# Patient Record
Sex: Female | Born: 1955 | Race: White | Hispanic: No | Smoking: Never smoker
Health system: Southern US, Community
[De-identification: ages and names within clinical notes are randomized; demographics above are authoritative.]

## PROBLEM LIST (undated history)

## (undated) DIAGNOSIS — M1611 Unilateral primary osteoarthritis, right hip: Secondary | ICD-10-CM

## (undated) DIAGNOSIS — T4145XA Adverse effect of unspecified anesthetic, initial encounter: Secondary | ICD-10-CM

## (undated) DIAGNOSIS — Z9889 Other specified postprocedural states: Secondary | ICD-10-CM

## (undated) DIAGNOSIS — D62 Acute posthemorrhagic anemia: Secondary | ICD-10-CM

## (undated) DIAGNOSIS — R040 Epistaxis: Secondary | ICD-10-CM

## (undated) DIAGNOSIS — M1990 Unspecified osteoarthritis, unspecified site: Secondary | ICD-10-CM

## (undated) DIAGNOSIS — K219 Gastro-esophageal reflux disease without esophagitis: Secondary | ICD-10-CM

## (undated) DIAGNOSIS — G43909 Migraine, unspecified, not intractable, without status migrainosus: Secondary | ICD-10-CM

## (undated) DIAGNOSIS — B029 Zoster without complications: Secondary | ICD-10-CM

## (undated) DIAGNOSIS — R195 Other fecal abnormalities: Secondary | ICD-10-CM

## (undated) DIAGNOSIS — S92909A Unspecified fracture of unspecified foot, initial encounter for closed fracture: Secondary | ICD-10-CM

## (undated) DIAGNOSIS — R2681 Unsteadiness on feet: Secondary | ICD-10-CM

## (undated) DIAGNOSIS — K589 Irritable bowel syndrome without diarrhea: Secondary | ICD-10-CM

## (undated) DIAGNOSIS — T8859XA Other complications of anesthesia, initial encounter: Secondary | ICD-10-CM

## (undated) DIAGNOSIS — Z889 Allergy status to unspecified drugs, medicaments and biological substances status: Secondary | ICD-10-CM

## (undated) DIAGNOSIS — R112 Nausea with vomiting, unspecified: Secondary | ICD-10-CM

## (undated) DIAGNOSIS — R7309 Other abnormal glucose: Secondary | ICD-10-CM

## (undated) HISTORY — DX: Other fecal abnormalities: R19.5

## (undated) HISTORY — DX: Acute posthemorrhagic anemia: D62

## (undated) HISTORY — DX: Migraine, unspecified, not intractable, without status migrainosus: G43.909

## (undated) HISTORY — DX: Unspecified fracture of unspecified foot, initial encounter for closed fracture: S92.909A

## (undated) HISTORY — DX: Zoster without complications: B02.9

## (undated) HISTORY — PX: APPENDECTOMY: SHX54

## (undated) HISTORY — DX: Irritable bowel syndrome, unspecified: K58.9

## (undated) HISTORY — DX: Unilateral primary osteoarthritis, right hip: M16.11

## (undated) HISTORY — DX: Gastro-esophageal reflux disease without esophagitis: K21.9

## (undated) HISTORY — DX: Unsteadiness on feet: R26.81

## (undated) HISTORY — DX: Other abnormal glucose: R73.09

---

## 1999-11-22 ENCOUNTER — Other Ambulatory Visit: Admission: RE | Admit: 1999-11-22 | Discharge: 1999-11-22 | Payer: Self-pay | Admitting: *Deleted

## 2000-11-16 ENCOUNTER — Other Ambulatory Visit: Admission: RE | Admit: 2000-11-16 | Discharge: 2000-11-16 | Payer: Self-pay | Admitting: *Deleted

## 2001-11-16 ENCOUNTER — Other Ambulatory Visit: Admission: RE | Admit: 2001-11-16 | Discharge: 2001-11-16 | Payer: Self-pay | Admitting: *Deleted

## 2002-12-18 ENCOUNTER — Other Ambulatory Visit: Admission: RE | Admit: 2002-12-18 | Discharge: 2002-12-18 | Payer: Self-pay | Admitting: *Deleted

## 2004-01-05 ENCOUNTER — Other Ambulatory Visit: Admission: RE | Admit: 2004-01-05 | Discharge: 2004-01-05 | Payer: Self-pay | Admitting: *Deleted

## 2005-01-13 ENCOUNTER — Other Ambulatory Visit: Admission: RE | Admit: 2005-01-13 | Discharge: 2005-01-13 | Payer: Self-pay | Admitting: *Deleted

## 2006-02-27 ENCOUNTER — Other Ambulatory Visit: Admission: RE | Admit: 2006-02-27 | Discharge: 2006-02-27 | Payer: Self-pay | Admitting: Obstetrics & Gynecology

## 2007-01-05 ENCOUNTER — Ambulatory Visit: Payer: Self-pay | Admitting: Family Medicine

## 2007-04-27 ENCOUNTER — Other Ambulatory Visit: Admission: RE | Admit: 2007-04-27 | Discharge: 2007-04-27 | Payer: Self-pay | Admitting: Obstetrics and Gynecology

## 2007-06-18 LAB — HM COLONOSCOPY: HM COLON: NORMAL

## 2008-04-29 ENCOUNTER — Other Ambulatory Visit: Admission: RE | Admit: 2008-04-29 | Discharge: 2008-04-29 | Payer: Self-pay | Admitting: Obstetrics and Gynecology

## 2013-08-17 DIAGNOSIS — S92909A Unspecified fracture of unspecified foot, initial encounter for closed fracture: Secondary | ICD-10-CM

## 2013-08-17 HISTORY — DX: Unspecified fracture of unspecified foot, initial encounter for closed fracture: S92.909A

## 2013-10-15 ENCOUNTER — Ambulatory Visit (INDEPENDENT_AMBULATORY_CARE_PROVIDER_SITE_OTHER): Payer: BC Managed Care – PPO | Admitting: Nurse Practitioner

## 2013-10-15 ENCOUNTER — Encounter: Payer: Self-pay | Admitting: Nurse Practitioner

## 2013-10-15 VITALS — BP 132/94 | HR 76 | Ht 69.5 in | Wt 174.0 lb

## 2013-10-15 DIAGNOSIS — Z1211 Encounter for screening for malignant neoplasm of colon: Secondary | ICD-10-CM

## 2013-10-15 DIAGNOSIS — Z01419 Encounter for gynecological examination (general) (routine) without abnormal findings: Secondary | ICD-10-CM

## 2013-10-15 DIAGNOSIS — Z0000 Encounter for general adult medical examination without abnormal findings: Secondary | ICD-10-CM

## 2013-10-15 LAB — COMPREHENSIVE METABOLIC PANEL
ALT: 15 U/L (ref 0–35)
AST: 17 U/L (ref 0–37)
Albumin: 4.5 g/dL (ref 3.5–5.2)
Alkaline Phosphatase: 100 U/L (ref 39–117)
Glucose, Bld: 88 mg/dL (ref 70–99)
Potassium: 4.6 mEq/L (ref 3.5–5.3)
Sodium: 139 mEq/L (ref 135–145)
Total Protein: 6.8 g/dL (ref 6.0–8.3)

## 2013-10-15 LAB — POCT URINALYSIS DIPSTICK
Blood, UA: NEGATIVE
Glucose, UA: NEGATIVE
Nitrite, UA: NEGATIVE
Protein, UA: NEGATIVE
Urobilinogen, UA: NEGATIVE

## 2013-10-15 LAB — HEMOGLOBIN, FINGERSTICK: Hemoglobin, fingerstick: 13.7 g/dL (ref 12.0–16.0)

## 2013-10-15 LAB — LIPID PANEL
LDL Cholesterol: 136 mg/dL — ABNORMAL HIGH (ref 0–99)
Total CHOL/HDL Ratio: 3.9 Ratio

## 2013-10-15 MED ORDER — ERGOCALCIFEROL 1.25 MG (50000 UT) PO CAPS: 50000.0000 [IU] | ORAL_CAPSULE | ORAL | Status: DC

## 2013-10-15 NOTE — Progress Notes (Signed)
Patient ID: Amanda Washington, female   DOB: [birth year], 56 y.o.   MRN: [id number] 57 y.o. G0P0 Divorced Caucasian Fe here for annual exam.  At last AEX discussed starting HRT secondary to dramatic vaso symptoms and insomnia.  After going home and reading more about HRT - decided against HRT and never started.  Currently vaso symptoms are better and now more manageable.  She is not SA.  She did have a traumatic fall at work on some wet leaves, and fractured one of the metatarsals of left foot and ankle.  She is in a walking boot since Thanksgiving.  Patient's last menstrual period was 05/30/2010.          Sexually active: no  The current method of family planning is post menopausal status.    Exercising: no  The patient does not participate in regular exercise at present. Smoker:  no  Health Maintenance: Pap:  08/22/12, WNL, neg HR HPV MMG:  03/12/13, Bi-Rads 2: benign findings Colonoscopy:  9/08, normal, repeat in 10 years BMD:   4/09, 1.5/0.0/0.4 TDaP:  04/2007 Labs: HB:  13.7 Urine: negative, pH 8.0   reports that she has never smoked. She has never used smokeless tobacco. She reports that she drinks alcohol. She reports that she does not use illicit drugs.  Past Medical History  Diagnosis Date  . IBS (irritable bowel syndrome)   . Migraine headache     better since off OCP 07/2010  . Fracture of foot 08/2013    left    Past Surgical History  Procedure Laterality Date  . Appendectomy      Current Outpatient Prescriptions  Medication Sig Dispense Refill  . ergocalciferol (VITAMIN D2) 50000 UNITS capsule Take 1 capsule (50,000 Units total) by mouth once a week.  30 capsule  2  . glucosamine-chondroitin 500-400 MG tablet Take 1 tablet by mouth 3 (three) times daily.      Marland Kitchen KRILL OIL PO Take by mouth.      . Multiple Vitamin (MULTIVITAMIN) tablet Take 1 tablet by mouth daily.       No current facility-administered medications for this visit.    Family History  Problem Relation  Age of Onset  . Alzheimer's disease Mother 55  . Prostate cancer Father 56  . Hyperlipidemia Father   . Breast cancer Maternal Grandmother 90    ROS:  Pertinent items are noted in HPI.  Otherwise, a comprehensive ROS was negative.  Exam:   BP 132/94  Pulse 76  Ht 5' 9.5" (1.765 m)  Wt 174 lb (78.926 kg)  BMI 25.34 kg/m2  LMP 05/30/2010 Height: 5' 9.5" (176.5 cm)  Ht Readings from Last 3 Encounters:  10/15/13 5' 9.5" (1.765 m)    General appearance: alert, cooperative and appears stated age Head: Normocephalic, without obvious abnormality, atraumatic Neck: no adenopathy, supple, symmetrical, trachea midline and thyroid normal to inspection and palpation Lungs: clear to auscultation bilaterally Breasts: normal appearance, no masses or tenderness Heart: regular rate and rhythm Abdomen: soft, non-tender; no masses,  no organomegaly Extremities: extremities normal, atraumatic, no cyanosis or edema Skin: Skin color, texture, turgor normal. No rashes or lesions Lymph nodes: Cervical, supraclavicular, and axillary nodes normal. No abnormal inguinal nodes palpated Neurologic: Grossly normal   Pelvic: External genitalia:  no lesions, all very strophic              Urethra:  normal appearing urethra with no masses, tenderness or lesions  Bartholin's and Skene's: normal                 Vagina: atrophic appearing vagina with pale color and discharge, no lesions              Cervix: anteverted              Pap taken: no Bimanual Exam:  Uterus:  normal size, contour, position, consistency, mobility, non-tender              Adnexa: no mass, fullness, tenderness               Rectovaginal: Confirms               Anus:  normal sphincter tone, no lesions  A:  Well Woman with normal exam  Postmenopausal no HRT  Fracture left foot and ankle - traumatic fall at work 11/14  History of migraine HA's better since off OCP  P:   Pap smear as per guidelines   Mammogram due  5/15  IFOB given - do yearly per Dr. Loreta Ave  Counseled on breast self exam, mammography screening, adequate intake of calcium and vitamin D, diet and exercise, Kegel's exercises return annually or prn  An After Visit Summary was printed and given to the patient.

## 2013-10-15 NOTE — Progress Notes (Signed)
Encounter reviewed by Dr. Brook Silva.  

## 2013-10-15 NOTE — Patient Instructions (Signed)

## 2013-10-16 LAB — VITAMIN D 25 HYDROXY (VIT D DEFICIENCY, FRACTURES): Vit D, 25-Hydroxy: 41 ng/mL (ref 30–89)

## 2013-10-18 ENCOUNTER — Telehealth: Payer: Self-pay

## 2013-10-18 NOTE — Telephone Encounter (Signed)
Lmtcb//kn 

## 2013-10-18 NOTE — Telephone Encounter (Signed)
Patient is calling Amanda Washington

## 2013-10-18 NOTE — Telephone Encounter (Signed)
Message copied by Elisha HeadlandNIX, Terrez Ander S on Fri Oct 18, 2013  9:10 AM ------      Message from: Ria CommentGRUBB, PATRICIA R      Created: Fri Oct 18, 2013  8:35 AM       Let patient know that total cholesterol is at 220 and LDL is 136.    In Feb 2014 cholesterol was 196 and LDL @ 115.  Needs to restart low cholesterol diet.  Vit D is good at 41 follow protocol.  CMP and TSH is normal. ------

## 2013-10-22 NOTE — Telephone Encounter (Signed)
Lmtcb//kn 

## 2013-11-07 NOTE — Telephone Encounter (Signed)
Patient notified of all results-see lab results.

## 2013-12-11 LAB — FECAL OCCULT BLOOD, IMMUNOCHEMICAL: IMMUNOLOGICAL FECAL OCCULT BLOOD TEST: NEGATIVE

## 2013-12-11 NOTE — Addendum Note (Signed)
Addended by: Luisa DagoPHILLIPS, Safwan Tomei C on: 12/11/2013 09:47 AM   Modules accepted: Orders

## 2014-01-24 ENCOUNTER — Telehealth: Payer: Self-pay | Admitting: Nurse Practitioner

## 2014-01-24 NOTE — Telephone Encounter (Signed)
Patient has some questions about screening tests that may have been done at her last annual exam. She is completing some paperwork for insurance and need this information to do this.

## 2014-01-24 NOTE — Telephone Encounter (Signed)
Spoke with patient. Patient states that she is filling out insurance information and does not remember if she had a pap smear on 12/30 and which labs were drawn. Advised that pap smear was not completed on 12/30. Labs done on 12/30 included hemoglobin, vit d, lipid panel, CMP, and TSH. Fecal occult blood test was done on 2/23. Patient is agreeable and verbalizes understanding. Will call back with any further needs or questions.   Routing to provider for final review. Patient agreeable to disposition. Will close encounter

## 2014-10-21 ENCOUNTER — Ambulatory Visit (INDEPENDENT_AMBULATORY_CARE_PROVIDER_SITE_OTHER): Payer: BC Managed Care – PPO | Admitting: Nurse Practitioner

## 2014-10-21 ENCOUNTER — Encounter: Payer: Self-pay | Admitting: Nurse Practitioner

## 2014-10-21 VITALS — BP 126/78 | HR 68 | Ht 69.5 in | Wt 175.0 lb

## 2014-10-21 DIAGNOSIS — Z01419 Encounter for gynecological examination (general) (routine) without abnormal findings: Secondary | ICD-10-CM

## 2014-10-21 DIAGNOSIS — Z1211 Encounter for screening for malignant neoplasm of colon: Secondary | ICD-10-CM

## 2014-10-21 DIAGNOSIS — N952 Postmenopausal atrophic vaginitis: Secondary | ICD-10-CM

## 2014-10-21 DIAGNOSIS — Z0000 Encounter for general adult medical examination without abnormal findings: Secondary | ICD-10-CM

## 2014-10-21 DIAGNOSIS — E2839 Other primary ovarian failure: Secondary | ICD-10-CM

## 2014-10-21 LAB — POCT URINALYSIS DIPSTICK
BILIRUBIN UA: NEGATIVE
Blood, UA: NEGATIVE
GLUCOSE UA: NEGATIVE
KETONES UA: NEGATIVE
Leukocytes, UA: NEGATIVE
Nitrite, UA: NEGATIVE
Protein, UA: NEGATIVE
Urobilinogen, UA: NEGATIVE
pH, UA: 5

## 2014-10-21 MED ORDER — ERGOCALCIFEROL 1.25 MG (50000 UT) PO CAPS: 50000.0000 [IU] | ORAL_CAPSULE | ORAL | Status: DC

## 2014-10-21 NOTE — Progress Notes (Signed)
Patient ID: Amanda Washington, female   DOB: 10/14/1956, 59 y.o.   MRN: [id number] 59 y.o. G0P0 Divorced Caucasian Fe here for annual exam. Very few menopausal symptoms, not dating or SA.   No new diagnosis.  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 This incident did not happen until the fall at work a year ago when she fractured her left foot.  Patient's last menstrual period was 05/30/2010 (exact date).          Sexually active: no  The current method of family planning is post menopausal status.  Exercising: no The patient does not participate in regular exercise at present. Smoker: no  Health Maintenance: Pap: 08/22/12, WNL, neg HR HPV MMG: 04/24/14, 3D, Bi-Rads 2:  Benign findings  Colonoscopy: 9/08, normal, repeat in 10 years BMD: 01/21/08, 1.5/0.0/0.4 TDaP: 04/2007 Labs:  Not drawn today, would prefer to have fasting labs  Urine:  Negative    reports that she has never smoked. She has never used smokeless tobacco. She reports that she drinks alcohol. She reports that she does not use illicit drugs.  Past Medical History  Diagnosis Date  . IBS (irritable bowel syndrome)   . Migraine headache     better since off OCP 07/2010  . Fracture of foot 08/2013    left    Past Surgical History  Procedure Laterality Date  . Appendectomy      Current Outpatient Prescriptions  Medication Sig Dispense Refill  . ergocalciferol (VITAMIN D2) 50000 UNITS capsule Take 1 capsule (50,000 Units total) by mouth once a week. 30 capsule 2  . glucosamine-chondroitin 500-400 MG tablet Take 1 tablet by mouth 3 (three) times daily.    Marland Kitchen. KRILL OIL PO Take by mouth.    . Multiple Vitamin (MULTIVITAMIN) tablet Take 1 tablet by mouth daily.     No current facility-administered medications for this visit.    Family History  Problem Relation Age of Onset  . Alzheimer's disease Mother 859  . Prostate cancer Father 58 72  . Hyperlipidemia Father   . Breast cancer Maternal Grandmother 90     ROS:  Pertinent items are noted in HPI.  Otherwise, a comprehensive ROS was negative.  Exam:   BP 126/78 mmHg  Pulse 68  Ht 5' 9.5" (1.765 m)  Wt 175 lb (79.379 kg)  BMI 25.48 kg/m2  LMP 05/30/2010 (Exact Date) Height: 5' 9.5" (176.5 cm)  Ht Readings from Last 3 Encounters:  10/21/14 5' 9.5" (1.765 m)  10/15/13 5' 9.5" (1.765 m)    General appearance: alert, cooperative and appears stated age Head: Normocephalic, without obvious abnormality, atraumatic Neck: no adenopathy, supple, symmetrical, trachea midline and thyroid normal to inspection and palpation Lungs: clear to auscultation bilaterally Breasts: normal appearance, no masses or tenderness Heart: regular rate and rhythm Abdomen: soft, non-tender; no masses,  no organomegaly Extremities: extremities normal, atraumatic, no cyanosis or edema Skin: Skin color, texture, turgor normal. No rashes or lesions Lymph nodes: Cervical, supraclavicular, and axillary nodes normal. No abnormal inguinal nodes palpated Neurologic: Grossly normal   Pelvic: External genitalia:  no lesions              Urethra:  normal appearing urethra with no masses, tenderness or lesions              Bartholin's and Skene's: normal                 Vagina: very atrophic appearing vagina with pale color and discharge,  no lesions              Cervix: anteverted              Pap taken: No. Bimanual Exam:  Uterus:  normal size, contour, position, consistency, mobility, non-tender              Adnexa: no mass, fullness, tenderness               Rectovaginal: Confirms               Anus:  normal sphincter tone, no lesions  A:  Well Woman with normal exam  Postmenopausal no HRT Fracture left foot and ankle - traumatic fall at work 11/14 History of migraine HA's better since off OCP  Atrophic vagintis  P:   Reviewed health and wellness pertinent to exam  Pap smear not taken today  Mammogram is due 04/2015  IFOB is  given  Counseled on breast self exam, mammography screening, adequate intake of calcium and vitamin D, diet and exercise, Kegel's exercises return annually or prn  An After Visit Summary was printed and given to the patient.

## 2014-10-21 NOTE — Patient Instructions (Signed)

## 2014-10-21 NOTE — Progress Notes (Signed)
Encounter reviewed by Dr. Cejay Cambre Silva.  

## 2014-10-30 ENCOUNTER — Other Ambulatory Visit (INDEPENDENT_AMBULATORY_CARE_PROVIDER_SITE_OTHER): Payer: BC Managed Care – PPO

## 2014-10-30 DIAGNOSIS — Z0000 Encounter for general adult medical examination without abnormal findings: Secondary | ICD-10-CM

## 2014-10-30 LAB — COMPREHENSIVE METABOLIC PANEL
ALT: 14 U/L (ref 0–35)
AST: 17 U/L (ref 0–37)
Albumin: 4.3 g/dL (ref 3.5–5.2)
Alkaline Phosphatase: 82 U/L (ref 39–117)
BUN: 16 mg/dL (ref 6–23)
CO2: 28 meq/L (ref 19–32)
Calcium: 9.6 mg/dL (ref 8.4–10.5)
Chloride: 103 mEq/L (ref 96–112)
Creat: 0.77 mg/dL (ref 0.50–1.10)
GLUCOSE: 90 mg/dL (ref 70–99)
Potassium: 4.4 mEq/L (ref 3.5–5.3)
SODIUM: 139 meq/L (ref 135–145)
Total Bilirubin: 0.6 mg/dL (ref 0.2–1.2)
Total Protein: 7.2 g/dL (ref 6.0–8.3)

## 2014-10-30 LAB — LIPID PANEL
CHOLESTEROL: 193 mg/dL (ref 0–200)
HDL: 60 mg/dL (ref >39)
LDL CALC: 116 mg/dL — AB (ref 0–99)
TRIGLYCERIDES: 83 mg/dL (ref <150)
Total CHOL/HDL Ratio: 3.2 Ratio
VLDL: 17 mg/dL (ref 0–40)

## 2014-10-31 LAB — VITAMIN D 25 HYDROXY (VIT D DEFICIENCY, FRACTURES): Vit D, 25-Hydroxy: 49 ng/mL (ref 30–100)

## 2014-10-31 LAB — TSH: TSH: 1.638 u[IU]/mL (ref 0.350–4.500)

## 2014-11-05 LAB — FECAL OCCULT BLOOD, IMMUNOCHEMICAL: IFOBT: NEGATIVE

## 2014-11-05 NOTE — Addendum Note (Signed)
Addended by: Dion BodyBELTRAN, REINA C on: 11/05/2014 02:05 PM   Modules accepted: Orders

## 2014-11-10 ENCOUNTER — Telehealth: Payer: Self-pay | Admitting: *Deleted

## 2014-11-10 NOTE — Telephone Encounter (Signed)
-----   Message from Lauro FranklinPatricia Rolen-Grubb, FNP sent at 11/06/2014  6:12 PM EST ----- Let patient know that IFOB is negative

## 2014-11-10 NOTE — Telephone Encounter (Signed)
I have attempted to contact this patient by phone with the following results: left message to return call to [place] at [phone] answering machine ([facility]  No personal information given.  [phone] (Home)

## 2014-11-14 NOTE — Telephone Encounter (Signed)
Pt notified in result note 11/13/14.  Closing encounter.

## 2015-05-06 ENCOUNTER — Telehealth: Payer: Self-pay | Admitting: Nurse Practitioner

## 2015-05-06 NOTE — Telephone Encounter (Signed)
Peas let pt. Know that BMD from 04/24/15 shows a T Score at the spine of 1.2, left hip neck -1.2, right hip neck -1,1. The hips fall in the low normal range.  Comparison from 2009 of the spine shows a decrease at the spine of -1.5% and the left hip shows a decrease by -12 %.  The FRAX score for 10 year risk of a major fracture is 11.8 % (goal is <20%);  FRAX score for major fracture at the hip is 0.8% (goal is <3%).  While some bone loss is expectant in post menopausal women there are things she can do to reduce the loss.  She must walk, do upper body exercise, calcium and Vit D.  Follow up in 2 years to check stability.

## 2015-05-07 NOTE — Telephone Encounter (Signed)
I have attempted to contact this patient by phone with the following results: left message to return my call on answering machine (home per [facility]).  [phone] (Home)

## 2015-05-08 NOTE — Telephone Encounter (Signed)
Pt notified of results.  She voices understanding and is agreeable with repeating in two years. Closing encounter.

## 2015-10-27 ENCOUNTER — Ambulatory Visit: Payer: Self-pay | Admitting: Orthopedic Surgery

## 2015-10-27 NOTE — Progress Notes (Signed)
Preoperative surgical orders have been place into the Epic hospital system [facility] on 10/27/2015, 9:08 PM  by Patrica DuelPERKINS, Allix Blomquist for surgery on 11/11/2015.  Preop Total Hip - Anterior Approach orders including IV Tylenol, and IV Decadron as long as there are no contraindications to the above medications. Avel Peacerew Santino Kinsella, PA-C

## 2015-10-28 ENCOUNTER — Encounter: Payer: Self-pay | Admitting: Nurse Practitioner

## 2015-10-28 ENCOUNTER — Ambulatory Visit (INDEPENDENT_AMBULATORY_CARE_PROVIDER_SITE_OTHER): Payer: BC Managed Care – PPO | Admitting: Nurse Practitioner

## 2015-10-28 VITALS — BP 132/82 | HR 84 | Ht 69.0 in | Wt 175.0 lb

## 2015-10-28 DIAGNOSIS — Z1211 Encounter for screening for malignant neoplasm of colon: Secondary | ICD-10-CM

## 2015-10-28 DIAGNOSIS — Z01419 Encounter for gynecological examination (general) (routine) without abnormal findings: Secondary | ICD-10-CM

## 2015-10-28 DIAGNOSIS — Z0000 Encounter for general adult medical examination without abnormal findings: Secondary | ICD-10-CM | POA: Diagnosis not present

## 2015-10-28 LAB — HEPATITIS C ANTIBODY: HCV Ab: NEGATIVE

## 2015-10-28 LAB — LIPID PANEL
CHOL/HDL RATIO: 2.7 ratio (ref <=5.0)
Cholesterol: 189 mg/dL (ref 125–200)
HDL: 71 mg/dL (ref >=46)
LDL Cholesterol: 107 mg/dL (ref <130)
Triglycerides: 53 mg/dL (ref <150)
VLDL: 11 mg/dL (ref <30)

## 2015-10-28 LAB — POCT URINALYSIS DIPSTICK
Bilirubin, UA: NEGATIVE
Blood, UA: NEGATIVE
Glucose, UA: NEGATIVE
KETONES UA: NEGATIVE
LEUKOCYTES UA: NEGATIVE
Nitrite, UA: NEGATIVE
PROTEIN UA: NEGATIVE
Urobilinogen, UA: NEGATIVE
pH, UA: 7

## 2015-10-28 LAB — HIV ANTIBODY (ROUTINE TESTING W REFLEX): HIV 1&2 Ab, 4th Generation: NONREACTIVE

## 2015-10-28 LAB — VITAMIN D 25 HYDROXY (VIT D DEFICIENCY, FRACTURES): Vit D, 25-Hydroxy: 58 ng/mL (ref 30–100)

## 2015-10-28 LAB — TSH: TSH: 2.157 u[IU]/mL (ref 0.350–4.500)

## 2015-10-28 LAB — HEMOGLOBIN A1C
Hgb A1c MFr Bld: 5.9 % — ABNORMAL HIGH (ref <5.7)
MEAN PLASMA GLUCOSE: 123 mg/dL — AB (ref <117)

## 2015-10-28 MED ORDER — ERGOCALCIFEROL 1.25 MG (50000 UT) PO CAPS: 50000.0000 [IU] | ORAL_CAPSULE | ORAL | Status: DC

## 2015-10-28 NOTE — Patient Instructions (Addendum)

## 2015-10-28 NOTE — Progress Notes (Signed)
Patient ID: Amanda Washington, female   DOB: Jul 26, 1956, 60 y.o.   MRN: [id number]  60 y.o. G0P0 Divorced  Caucasian Fe here for annual exam.  She has continued to have pain in the right hip.  Now will be having right hip replacement on 11/11/15.  Not dating or SA.  Patient's last menstrual period was 05/30/2010 (exact date).          Sexually active: No.  The current method of family planning is none and abstinence.    Exercising: No.  The patient does not participate in regular exercise at present. Smoker:  no  Health Maintenance: Pap:  08/22/12, negative with neg HR HPV MMG:  04/24/15, 3D, Bi-Rads 1:  Negative Colonoscopy:  06/2007, normal, repeat in 10 years BMD:   04/24/15, T Score 1.2 Spine / -1.1 Right Femur Neck / -1.2 Left Femur Neck TDaP:  04/2007 Shingles: Not indicated due to age Pneumonia:  Not indicated due to age Hep C and HIV: will complete today Labs: HB: 11.9  Urine:  negative   reports that she has never smoked. She has never used smokeless tobacco. She reports that she drinks alcohol. She reports that she does not use illicit drugs.  Past Medical History  Diagnosis Date  . IBS (irritable bowel syndrome)   . Migraine headache     better since off OCP 07/2010  . Fracture of foot 08/2013    left    Past Surgical History  Procedure Laterality Date  . Appendectomy      Current Outpatient Prescriptions  Medication Sig Dispense Refill  . ergocalciferol (VITAMIN D2) 50000 units capsule Take 1 capsule (50,000 Units total) by mouth once a week. 30 capsule 3  . Multiple Vitamin (MULTIVITAMIN) tablet Take 1 tablet by mouth daily.    . Naproxen Sodium (ALEVE) 220 MG CAPS Take 1 capsule by mouth 2 (two) times daily.     No current facility-administered medications for this visit.    Family History  Problem Relation Age of Onset  . Alzheimer's disease Mother 62  . Prostate cancer Father 46  . Hyperlipidemia Father   . Breast cancer Maternal Grandmother 90    ROS:   Pertinent items are noted in HPI.  Otherwise, a comprehensive ROS was negative.  Exam:   BP 132/82 mmHg  Pulse 84  Ht 5\' 9"  (1.753 m)  Wt 175 lb (79.379 kg)  BMI 25.83 kg/m2  LMP 05/30/2010 (Exact Date) Height: 5\' 9"  (175.3 cm) Ht Readings from Last 3 Encounters:  10/28/15 5\' 9"  (1.753 m)  10/21/14 5' 9.5" (1.765 m)  10/15/13 5' 9.5" (1.765 m)    General appearance: alert, cooperative and appears stated age Head: Normocephalic, without obvious abnormality, atraumatic Neck: no adenopathy, supple, symmetrical, trachea midline and thyroid normal to inspection and palpation Lungs: clear to auscultation bilaterally Breasts: normal appearance, no masses or tenderness Heart: regular rate and rhythm Abdomen: soft, non-tender; no masses,  no organomegaly Extremities: extremities normal, atraumatic, no cyanosis or edema Skin: Skin color, texture, turgor normal. No rashes or lesions Lymph nodes: Cervical, supraclavicular, and axillary nodes normal. No abnormal inguinal nodes palpated Neurologic: Grossly normal   Pelvic: External genitalia:  no lesions              Urethra:  normal appearing urethra with no masses, tenderness or lesions              Bartholin's and Skene's: normal  Vagina: normal appearing vagina with normal color and discharge, no lesions              Cervix: anteverted              Pap taken: Yes.   Bimanual Exam:  Uterus:  normal size, contour, position, consistency, mobility, non-tender              Adnexa: no mass, fullness, tenderness               Rectovaginal: Confirms               Anus:  normal sphincter tone, no lesions  Chaperone present: yes  A:  Well Woman with normal exam  Postmenopausal no HRT Fracture left foot and ankle - traumatic fall at work 11/14  For right hip replacement 11/11/15 History of migraine HA's better since off OCP Atrophic vaginitis   P:   Reviewed health and wellness pertinent  to exam  Pap smear as above  Mammogram is due 04/2016  Will follow with labs  IFOB is given today  Refill on Vit D for a year - will follow with labs  Counseled on breast self exam, mammography screening, adequate intake of calcium and vitamin D, diet and exercise return annually or prn  An After Visit Summary was printed and given to the patient.

## 2015-10-29 LAB — IPS PAP TEST WITH HPV

## 2015-10-31 NOTE — Progress Notes (Signed)
Encounter reviewed by Dr. Brook Amundson C. Silva.  

## 2015-11-02 LAB — HEMOGLOBIN, FINGERSTICK: Hemoglobin, fingerstick: 11.9 g/dL — ABNORMAL LOW (ref 12.0–16.0)

## 2015-11-04 NOTE — Patient Instructions (Signed)
Amanda Washington  11/04/2015   Your procedure is scheduled on:11-11-15 Wednesday   Report to [facility] Main  Entrance take [facility]  elevators to 3rd floor to  Short Stay Center at 0900  AM.  Call this number if you have problems the morning of surgery [phone]   Remember: ONLY 1 PERSON MAY GO WITH YOU TO SHORT STAY TO GET  READY MORNING OF YOUR SURGERY.  Do not eat food or drink liquids :After Midnight.     Take these medicines the morning of surgery with A SIP OF WATER: Ranitidine DO NOT TAKE ANY DIABETIC MEDICATIONS DAY OF YOUR SURGERY                               You may not have any metal on your body including hair pins and              piercings  Do not wear jewelry, make-up, lotions, powders or perfumes, deodorant             Do not wear nail polish.  Do not shave  48 hours prior to surgery.              Men may shave face and neck.   Do not bring valuables to the hospital. [place] IS NOT             RESPONSIBLE   FOR VALUABLES.  Contacts, dentures or bridgework may not be worn into surgery.  Leave suitcase in the car. After surgery it may be brought to your room.     Patients discharged the day of surgery will not be allowed to drive home.  Name and phone number of your driver:  Special Instructions: N/A              Please read over the following fact sheets you were given: _____________________________________________________________________             [facility] - Preparing for Surgery Before surgery, you can play an important role.  Because skin is not sterile, your skin needs to be as free of germs as possible.  You can reduce the number of germs on your skin by washing with CHG (chlorahexidine gluconate) soap before surgery.  CHG is an antiseptic cleaner which kills germs and bonds with the skin to continue killing germs even after washing. Please DO NOT use if you have an allergy to CHG or antibacterial soaps.  If your skin  becomes reddened/irritated stop using the CHG and inform your nurse when you arrive at Short Stay. Do not shave (including legs and underarms) for at least 48 hours prior to the first CHG shower.  You may shave your face/neck. Please follow these instructions carefully:  1.  Shower with CHG Soap the night before surgery and the  morning of Surgery.  2.  If you choose to wash your hair, wash your hair first as usual with your  normal  shampoo.  3.  After you shampoo, rinse your hair and body thoroughly to remove the  shampoo.                           4.  Use CHG as you would any other liquid soap.  You can apply chg directly  to the skin and wash  Gently with a scrungie or clean washcloth.  5.  Apply the CHG Soap to your body ONLY FROM THE NECK DOWN.   Do not use on face/ open                           Wound or open sores. Avoid contact with eyes, ears mouth and genitals (private parts).                       Wash face,  Genitals (private parts) with your normal soap.             6.  Wash thoroughly, paying special attention to the area where your surgery  will be performed.  7.  Thoroughly rinse your body with warm water from the neck down.  8.  DO NOT shower/wash with your normal soap after using and rinsing off  the CHG Soap.                9.  Pat yourself dry with a clean towel.            10.  Wear clean pajamas.            11.  Place clean sheets on your bed the night of your first shower and do not  sleep with pets. Day of Surgery : Do not apply any lotions/deodorants the morning of surgery.  Please wear clean clothes to the hospital/surgery center.  FAILURE TO FOLLOW THESE INSTRUCTIONS MAY RESULT IN THE CANCELLATION OF YOUR SURGERY PATIENT SIGNATURE_________________________________  NURSE SIGNATURE__________________________________  ________________________________________________________________________   Adam Phenix  An incentive spirometer is a  tool that can help keep your lungs clear and active. This tool measures how well you are filling your lungs with each breath. Taking long deep breaths may help reverse or decrease the chance of developing breathing (pulmonary) problems (especially infection) following:  A long period of time when you are unable to move or be active. BEFORE THE PROCEDURE   If the spirometer includes an indicator to show your best effort, your nurse or respiratory therapist will set it to a desired goal.  If possible, sit up straight or lean slightly forward. Try not to slouch.  Hold the incentive spirometer in an upright position. INSTRUCTIONS FOR USE   Sit on the edge of your bed if possible, or sit up as far as you can in bed or on a chair.  Hold the incentive spirometer in an upright position.  Breathe out normally.  Place the mouthpiece in your mouth and seal your lips tightly around it.  Breathe in slowly and as deeply as possible, raising the piston or the ball toward the top of the column.  Hold your breath for 3-5 seconds or for as long as possible. Allow the piston or ball to fall to the bottom of the column.  Remove the mouthpiece from your mouth and breathe out normally.  Rest for a few seconds and repeat Steps 1 through 7 at least 10 times every 1-2 hours when you are awake. Take your time and take a few normal breaths between deep breaths.  The spirometer may include an indicator to show your best effort. Use the indicator as a goal to work toward during each repetition.  After each set of 10 deep breaths, practice coughing to be sure your lungs are clear. If you have an incision (the cut made at the time of surgery),  support your incision when coughing by placing a pillow or rolled up towels firmly against it. Once you are able to get out of bed, walk around indoors and cough well. You may stop using the incentive spirometer when instructed by your caregiver.  RISKS AND  COMPLICATIONS  Take your time so you do not get dizzy or light-headed.  If you are in pain, you may need to take or ask for pain medication before doing incentive spirometry. It is harder to take a deep breath if you are having pain. AFTER USE  Rest and breathe slowly and easily.  It can be helpful to keep track of a log of your progress. Your caregiver can provide you with a simple table to help with this. If you are using the spirometer at home, follow these instructions: [place] IF:   You are having difficultly using the spirometer.  You have trouble using the spirometer as often as instructed.  Your pain medication is not giving enough relief while using the spirometer.  You develop fever of 100.5 F (38.1 C) or higher. SEEK IMMEDIATE MEDICAL CARE IF:   You cough up bloody sputum that had not been present before.  You develop fever of 102 F (38.9 C) or greater.  You develop worsening pain at or near the incision site. MAKE SURE YOU:   Understand these instructions.  Will watch your condition.  Will get help right away if you are not doing well or get worse. Document Released: 02/13/2007 Document Revised: 12/26/2011 Document Reviewed: 04/16/2007 ExitCare Patient Information 2014 Exit[employer], [place].   ________________________________________________________________________  WHAT IS A BLOOD TRANSFUSION? Blood Transfusion Information  A transfusion is the replacement of blood or some of its parts. Blood is made up of multiple cells which provide different functions.  Red blood cells carry oxygen and are used for blood loss replacement.  White blood cells fight against infection.  Platelets control bleeding.  Plasma helps clot blood.  Other blood products are available for specialized needs, such as hemophilia or other clotting disorders. BEFORE THE TRANSFUSION  Who gives blood for transfusions?   Healthy volunteers who are fully evaluated to make sure  their blood is safe. This is blood bank blood. Transfusion therapy is the safest it has ever been in the practice of medicine. Before blood is taken from a donor, a complete history is taken to make sure that person has no history of diseases nor engages in risky social behavior (examples are intravenous drug use or sexual activity with multiple partners). The donor's travel history is screened to minimize risk of transmitting infections, such as malaria. The donated blood is tested for signs of infectious diseases, such as HIV and hepatitis. The blood is then tested to be sure it is compatible with you in order to minimize the chance of a transfusion reaction. If you or a relative donates blood, this is often done in anticipation of surgery and is not appropriate for emergency situations. It takes many days to process the donated blood. RISKS AND COMPLICATIONS Although transfusion therapy is very safe and saves many lives, the main dangers of transfusion include:   Getting an infectious disease.  Developing a transfusion reaction. This is an allergic reaction to something in the blood you were given. Every precaution is taken to prevent this. The decision to have a blood transfusion has been considered carefully by your caregiver before blood is given. Blood is not given unless the benefits outweigh the risks. AFTER THE TRANSFUSION  Right after receiving a blood transfusion, you will usually feel much better and more energetic. This is especially true if your red blood cells have gotten low (anemic). The transfusion raises the level of the red blood cells which carry oxygen, and this usually causes an energy increase.  The nurse administering the transfusion will monitor you carefully for complications. HOME CARE INSTRUCTIONS  No special instructions are needed after a transfusion. You may find your energy is better. Speak with your caregiver about any limitations on activity for underlying diseases  you may have. SEEK MEDICAL CARE IF:   Your condition is not improving after your transfusion.  You develop redness or irritation at the intravenous (IV) site. SEEK IMMEDIATE MEDICAL CARE IF:  Any of the following symptoms occur over the next 12 hours:  Shaking chills.  You have a temperature by mouth above 102 F (38.9 C), not controlled by medicine.  Chest, back, or muscle pain.  People around you feel you are not acting correctly or are confused.  Shortness of breath or difficulty breathing.  Dizziness and fainting.  You get a rash or develop hives.  You have a decrease in urine output.  Your urine turns a dark color or changes to pink, red, or brown. Any of the following symptoms occur over the next 10 days:  You have a temperature by mouth above 102 F (38.9 C), not controlled by medicine.  Shortness of breath.  Weakness after normal activity.  The white part of the eye turns yellow (jaundice).  You have a decrease in the amount of urine or are urinating less often.  Your urine turns a dark color or changes to pink, red, or brown. Document Released: 09/30/2000 Document Revised: 12/26/2011 Document Reviewed: 05/19/2008 [facility] Patient Information 2014 [place], [place].  _______________________________________________________________________

## 2015-11-05 ENCOUNTER — Encounter (HOSPITAL_COMMUNITY)
Admission: RE | Admit: 2015-11-05 | Discharge: 2015-11-05 | Disposition: A | Discharge status: Home / Self Care | Payer: BC Managed Care – PPO | Source: Ambulatory Visit | Attending: Orthopedic Surgery | Admitting: Orthopedic Surgery

## 2015-11-05 ENCOUNTER — Encounter (HOSPITAL_COMMUNITY): Payer: Self-pay

## 2015-11-05 DIAGNOSIS — Z0183 Encounter for blood typing: Secondary | ICD-10-CM | POA: Diagnosis not present

## 2015-11-05 DIAGNOSIS — Z01812 Encounter for preprocedural laboratory examination: Secondary | ICD-10-CM | POA: Insufficient documentation

## 2015-11-05 DIAGNOSIS — M1611 Unilateral primary osteoarthritis, right hip: Secondary | ICD-10-CM | POA: Insufficient documentation

## 2015-11-05 HISTORY — DX: Allergy status to unspecified drugs, medicaments and biological substances: Z88.9

## 2015-11-05 HISTORY — DX: Epistaxis: R04.0

## 2015-11-05 HISTORY — DX: Unspecified osteoarthritis, unspecified site: M19.90

## 2015-11-05 LAB — COMPREHENSIVE METABOLIC PANEL
ALT: 18 U/L (ref 14–54)
AST: 23 U/L (ref 15–41)
Albumin: 4.3 g/dL (ref 3.5–5.0)
Alkaline Phosphatase: 76 U/L (ref 38–126)
Anion gap: 8 (ref 5–15)
BILIRUBIN TOTAL: 0.7 mg/dL (ref 0.3–1.2)
BUN: 18 mg/dL (ref 6–20)
CO2: 27 mmol/L (ref 22–32)
CREATININE: 0.71 mg/dL (ref 0.44–1.00)
Calcium: 9.3 mg/dL (ref 8.9–10.3)
Chloride: 103 mmol/L (ref 101–111)
GFR calc Af Amer: >60 mL/min (ref >60)
Glucose, Bld: 100 mg/dL — ABNORMAL HIGH (ref 65–99)
POTASSIUM: 4 mmol/L (ref 3.5–5.1)
Sodium: 138 mmol/L (ref 135–145)
TOTAL PROTEIN: 7.2 g/dL (ref 6.5–8.1)

## 2015-11-05 LAB — APTT: aPTT: 35 seconds (ref 24–37)

## 2015-11-05 LAB — PROTIME-INR
INR: 1.06 (ref 0.00–1.49)
PROTHROMBIN TIME: 14 s (ref 11.6–15.2)

## 2015-11-05 LAB — CBC
HEMATOCRIT: 36.2 % (ref 36.0–46.0)
Hemoglobin: 11.9 g/dL — ABNORMAL LOW (ref 12.0–15.0)
MCH: 29.2 pg (ref 26.0–34.0)
MCHC: 32.9 g/dL (ref 30.0–36.0)
MCV: 88.9 fL (ref 78.0–100.0)
Platelets: 260 10*3/uL (ref 150–400)
RBC: 4.07 MIL/uL (ref 3.87–5.11)
RDW: 12.9 % (ref 11.5–15.5)
WBC: 6.4 10*3/uL (ref 4.0–10.5)

## 2015-11-05 LAB — URINALYSIS, ROUTINE W REFLEX MICROSCOPIC
Bilirubin Urine: NEGATIVE
Glucose, UA: NEGATIVE mg/dL
HGB URINE DIPSTICK: NEGATIVE
Ketones, ur: NEGATIVE mg/dL
Leukocytes, UA: NEGATIVE
Nitrite: NEGATIVE
PH: 7 (ref 5.0–8.0)
Protein, ur: NEGATIVE mg/dL
SPECIFIC GRAVITY, URINE: 1.006 (ref 1.005–1.030)

## 2015-11-05 LAB — SURGICAL PCR SCREEN
MRSA, PCR: NEGATIVE
Staphylococcus aureus: POSITIVE — AB

## 2015-11-05 LAB — ABO/RH: ABO/RH(D): A POS

## 2015-11-06 NOTE — Pre-Procedure Instructions (Signed)
Pt positive for Staph. Called in prescription, spoke with pt over phone to notify and instruct, notified Dr. Lequita Halt, put in "special needs" of OR schedule.

## 2015-11-08 ENCOUNTER — Ambulatory Visit: Payer: Self-pay | Admitting: Orthopedic Surgery

## 2015-11-08 NOTE — H&P (Signed)
Amanda Washington DOB: Mar 04, 1956 Divorced / Language: [place] / Race: White Female Date of Admission:  11/11/2015 CC:  Right Hip Pain History of Present Illness The patient is a 61 year old female who comes in for a preoperative History and Physical. The patient is scheduled for a right total hip arthroplasty (anterior) to be performed by Dr. Gus Rankin. Aluisio, MD at [facility] on 11-11-2015. The patient is a 60 year old female who presents today for follow up of their hip. The patient is being followed for their right and left hip pain and osteoarthritis. They are 2 year(s) out from when symptoms began. Symptoms reported today include: pain, aching and throbbing. The patient feels that they are doing poorly and report their pain level to be moderate to severe. The following medication has been used for pain control: antiinflammatory medication (aleve). She had an injury at work 2 years ago injuring her left foot. She had to wear a boot for several weeks. She started to notice pain in the right hip after that. her left hip has started bothering her more in the last few months. She has had to make significant modifications in her activity over the past couple years. She works at a [employer] and has to drive from building to building because she can no longer walk that distance. She has not had cortisone injections before. She has pain in the left hip but not as much issue with stiffness as in the right. She is interested in having the right hip replaced and perhaps having a cortisone injection in the left hip. Unfortunately, the right hip has gotten progressively worse. It is bothering her at all times now. Occasionally, will wake her up at night. It is limiting what she can and cannot do. She has less lateral mobility. She is unable to even do regular activities of daily living because of the hip. They have been treated conservatively in the past for the above stated problem and despite  conservative measures, they continue to have progressive pain and severe functional limitations and dysfunction. They have failed non-operative management including home exercise, medications. It is felt that they would benefit from undergoing total joint replacement. Risks and benefits of the procedure have been discussed with the patient and they elect to proceed with surgery. There are no active contraindications to surgery such as ongoing infection or rapidly progressive neurological disease.  Problem List/Past Medical Pain of left hip joint (M25.552)  Primary localized osteoarthritis of right hip (M16.11)  Migraine Headache  Hypertension  Menopause  Measles  Mumps  Allergies  Amoxicillin *PENICILLINS*  Hives. ? Reaction  Family History  Osteoarthritis  Father, Paternal Grandmother. Cancer  Father, Maternal Grandmother.  Social History No history of drug/alcohol rehab  Marital status  divorced Tobacco use  Never smoker. 03/04/2015 Not under pain contract  Living situation  live alone Current drinker  03/04/2015: Currently drinks beer, wine and hard liquor only occasionally per week Children  0 Exercise  Exercises rarely; does running / walking Current work status  working full time [employer] versus Rehab (Patient lives alone).  Medication History Vitamin D2 (Oral) Specific strength unknown - Active. Aleve (  Tablet, Oral) Active.  Past Surgical History  Appendectomy  Colonoscopy  Wisdom Teeth Extraction    Review of Systems General Not Present- Chills, Fatigue, Fever, Memory Loss, Night Sweats, Weight Gain and Weight Loss. Skin Not Present- Eczema, Hives, Itching, Lesions and Rash. HEENT Not Present- Dentures, Double Vision, Headache,  Hearing Loss, Tinnitus and Visual Loss. Respiratory Not Present- Allergies, Chronic Cough, Coughing up blood, Shortness of breath at rest and Shortness of breath with exertion. Cardiovascular  Not Present- Chest Pain, Difficulty Breathing Lying Down, Murmur, Palpitations, Racing/skipping heartbeats and Swelling. Gastrointestinal Present- Constipation. Not Present- Abdominal Pain, Bloody Stool, Diarrhea, Difficulty Swallowing, Heartburn, Jaundice, Loss of appetitie, Nausea and Vomiting. Female Genitourinary Not Present- Blood in Urine, Discharge, Flank Pain, Incontinence, Painful Urination, Urgency, Urinary frequency, Urinary Retention, Urinating at Night and Weak urinary stream. Musculoskeletal Present- Joint Pain, Morning Stiffness and Muscle Weakness. Not Present- Back Pain, Joint Swelling, Muscle Pain and Spasms. Neurological Not Present- Blackout spells, Difficulty with balance, Dizziness, Paralysis, Tremor and Weakness. Psychiatric Not Present- Insomnia.  Vitals Weight: 175 lb Height: 70.5in Weight was reported by patient. Height was reported by patient. Body Surface Area: 1.98 m Body Mass Index: 24.75 kg/m  BP: 132/78 (Sitting, Left Arm, Standard)  Physical Exam General Mental Status -Alert, cooperative and good historian. General Appearance-pleasant, Not in acute distress. Orientation-Oriented X3. Build & Nutrition-Well nourished and Well developed.  Head and Neck Head-normocephalic, atraumatic . Neck Global Assessment - supple, no bruit auscultated on the right, no bruit auscultated on the left.  Eye Vision-Wears corrective lenses. Pupil - Bilateral-Regular and Round. Motion - Bilateral-EOMI.  Chest and Lung Exam Auscultation Breath sounds - clear at anterior chest wall and clear at posterior chest wall. Adventitious sounds - No Adventitious sounds.  Cardiovascular Auscultation Rhythm - Regular rate and rhythm. Heart Sounds - S1 WNL and S2 WNL. Murmurs & Other Heart Sounds - Auscultation of the heart reveals - No Murmurs.  Abdomen Palpation/Percussion Tenderness - Abdomen is non-tender to palpation. Rigidity (guarding) - Abdomen is  soft. Auscultation Auscultation of the abdomen reveals - Bowel sounds normal.  Female Genitourinary Note: Not done, not pertinent to present illness   Musculoskeletal Note: Well developed female, in no distress. Right hip can be flexed to 100, no internal rotation, about 10 external rotation, 10 to 20 of abduction. Left hip flexion about 120, rotation in 10, out 30, abduction 30 with slight discomfort. Knee show no effusion. There was crepitus on range of motion of both knees. There is no joint line tenderness or instability. Pulse, sensation and motor intact distally.  RADIOGRAPHS AP pelvis and lateral hips reviewed today. She has got bone on bone arthritis of the right hip with subchondral cystic formation and with dysplastic changes. 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   Assessment & Plan Primary localized osteoarthritis of right hip (M16.11)  Note:Surgical Plans: Right Total Hip Replacement - Anterior Approach  Disposition: Home versus Rehab  PCP: Dr. Lysbeth Galas - patient has been seen and given a verbal clearance.  IV TXA  Anesthesia Issues: None  Signed electronically by Lauraine Rinne, III PA-C

## 2015-11-11 ENCOUNTER — Inpatient Hospital Stay (HOSPITAL_COMMUNITY): Payer: BC Managed Care – PPO

## 2015-11-11 ENCOUNTER — Inpatient Hospital Stay (HOSPITAL_COMMUNITY): Payer: BC Managed Care – PPO | Admitting: Anesthesiology

## 2015-11-11 ENCOUNTER — Encounter (HOSPITAL_COMMUNITY): Payer: Self-pay

## 2015-11-11 ENCOUNTER — Encounter (HOSPITAL_COMMUNITY)
Admission: RE | Disposition: A | Discharge status: Skilled Nursing Facility | Payer: Self-pay | Source: Ambulatory Visit | Attending: Orthopedic Surgery

## 2015-11-11 ENCOUNTER — Inpatient Hospital Stay (HOSPITAL_COMMUNITY)
Admission: RE | Admit: 2015-11-11 | Discharge: 2015-11-13 | DRG: 470 | Disposition: A | Discharge status: Skilled Nursing Facility | Payer: BC Managed Care – PPO | Source: Ambulatory Visit | Attending: Orthopedic Surgery | Admitting: Orthopedic Surgery

## 2015-11-11 DIAGNOSIS — M25551 Pain in right hip: Secondary | ICD-10-CM | POA: Diagnosis present

## 2015-11-11 DIAGNOSIS — M169 Osteoarthritis of hip, unspecified: Secondary | ICD-10-CM | POA: Diagnosis present

## 2015-11-11 DIAGNOSIS — K219 Gastro-esophageal reflux disease without esophagitis: Secondary | ICD-10-CM | POA: Diagnosis present

## 2015-11-11 DIAGNOSIS — M1611 Unilateral primary osteoarthritis, right hip: Principal | ICD-10-CM | POA: Diagnosis present

## 2015-11-11 DIAGNOSIS — I10 Essential (primary) hypertension: Secondary | ICD-10-CM | POA: Diagnosis present

## 2015-11-11 DIAGNOSIS — Z01812 Encounter for preprocedural laboratory examination: Secondary | ICD-10-CM

## 2015-11-11 DIAGNOSIS — Z96649 Presence of unspecified artificial hip joint: Secondary | ICD-10-CM

## 2015-11-11 HISTORY — DX: Other specified postprocedural states: Z98.890

## 2015-11-11 HISTORY — DX: Other complications of anesthesia, initial encounter: T88.59XA

## 2015-11-11 HISTORY — DX: Adverse effect of unspecified anesthetic, initial encounter: T41.45XA

## 2015-11-11 HISTORY — DX: Other specified postprocedural states: R11.2

## 2015-11-11 HISTORY — PX: TOTAL HIP ARTHROPLASTY: SHX124

## 2015-11-11 LAB — TYPE AND SCREEN
ABO/RH(D): A POS
Antibody Screen: NEGATIVE

## 2015-11-11 SURGERY — ARTHROPLASTY, HIP, TOTAL, ANTERIOR APPROACH
Anesthesia: Spinal | Site: Knee | Laterality: Right

## 2015-11-11 MED ORDER — POTASSIUM CHLORIDE IN NACL 20-0.9 MEQ/L-% IV SOLN
INTRAVENOUS | Status: DC
  Administered 2015-11-11: 18:00:00 via INTRAVENOUS
  Filled 2015-11-11 (×3): qty 1000

## 2015-11-11 MED ORDER — POLYETHYLENE GLYCOL 3350 17 G PO PACK
17.0000 g | PACK | Freq: Every day | ORAL | Status: DC | PRN
  Administered 2015-11-12: 17 g via ORAL
  Filled 2015-11-11: qty 1

## 2015-11-11 MED ORDER — PROMETHAZINE HCL 25 MG/ML IJ SOLN
6.2500 mg | INTRAMUSCULAR | Status: DC | PRN
Start: 2015-11-11 — End: 2015-11-11

## 2015-11-11 MED ORDER — PHENYLEPHRINE HCL 10 MG/ML IJ SOLN
INTRAMUSCULAR | Status: DC | PRN
  Administered 2015-11-11 (×3): 40 ug via INTRAVENOUS
  Administered 2015-11-11: 80 ug via INTRAVENOUS
  Administered 2015-11-11: 40 ug via INTRAVENOUS

## 2015-11-11 MED ORDER — 0.9 % SODIUM CHLORIDE (POUR BTL) OPTIME
TOPICAL | Status: DC | PRN
  Administered 2015-11-11: 1000 mL

## 2015-11-11 MED ORDER — LIDOCAINE HCL (CARDIAC) 20 MG/ML IV SOLN
INTRAVENOUS | Status: AC
  Filled 2015-11-11: qty 5

## 2015-11-11 MED ORDER — FENTANYL CITRATE (PF) 100 MCG/2ML IJ SOLN
INTRAMUSCULAR | Status: AC
  Filled 2015-11-11: qty 2

## 2015-11-11 MED ORDER — HYDROMORPHONE HCL 1 MG/ML IJ SOLN
0.2500 mg | INTRAMUSCULAR | Status: DC | PRN
  Administered 2015-11-11 (×2): 0.5 mg via INTRAVENOUS

## 2015-11-11 MED ORDER — VANCOMYCIN HCL IN DEXTROSE 1-5 GM/200ML-% IV SOLN
1000.0000 mg | INTRAVENOUS | Status: AC
  Administered 2015-11-11: 1000 mg via INTRAVENOUS
  Filled 2015-11-11: qty 200

## 2015-11-11 MED ORDER — MIDAZOLAM HCL 5 MG/5ML IJ SOLN
INTRAMUSCULAR | Status: DC | PRN
Start: 2015-11-11 — End: 2015-11-11
  Administered 2015-11-11: 2 mg via INTRAVENOUS

## 2015-11-11 MED ORDER — PROPOFOL 10 MG/ML IV BOLUS
INTRAVENOUS | Status: AC
  Filled 2015-11-11: qty 20

## 2015-11-11 MED ORDER — SODIUM CHLORIDE 0.9 % IV SOLN
INTRAVENOUS | Status: DC
  Administered 2015-11-11: 10:00:00 via INTRAVENOUS

## 2015-11-11 MED ORDER — MENTHOL 3 MG MT LOZG: 1.0000 | LOZENGE | OROMUCOSAL | Status: DC | PRN

## 2015-11-11 MED ORDER — ACETAMINOPHEN 10 MG/ML IV SOLN
1000.0000 mg | Freq: Once | INTRAVENOUS | Status: AC
  Administered 2015-11-11: 1000 mg via INTRAVENOUS

## 2015-11-11 MED ORDER — MEPERIDINE HCL 50 MG/ML IJ SOLN
6.2500 mg | INTRAMUSCULAR | Status: DC | PRN
Start: 2015-11-11 — End: 2015-11-11
  Administered 2015-11-11: 12.5 mg via INTRAVENOUS

## 2015-11-11 MED ORDER — VANCOMYCIN HCL IN DEXTROSE 1-5 GM/200ML-% IV SOLN
1000.0000 mg | Freq: Two times a day (BID) | INTRAVENOUS | Status: AC
  Administered 2015-11-12: 1000 mg via INTRAVENOUS
  Filled 2015-11-11: qty 200

## 2015-11-11 MED ORDER — BUPIVACAINE HCL (PF) 0.25 % IJ SOLN
INTRAMUSCULAR | Status: DC | PRN
  Administered 2015-11-11: 30 mL

## 2015-11-11 MED ORDER — ONDANSETRON HCL 4 MG/2ML IJ SOLN
INTRAMUSCULAR | Status: AC
  Filled 2015-11-11: qty 2

## 2015-11-11 MED ORDER — FENTANYL CITRATE (PF) 100 MCG/2ML IJ SOLN
INTRAMUSCULAR | Status: DC | PRN
  Administered 2015-11-11: 100 ug via INTRAVENOUS

## 2015-11-11 MED ORDER — PROPOFOL 500 MG/50ML IV EMUL
INTRAVENOUS | Status: DC | PRN
  Administered 2015-11-11: 100 ug/kg/min via INTRAVENOUS

## 2015-11-11 MED ORDER — MORPHINE SULFATE (PF) 2 MG/ML IV SOLN: 1.0000 mg | INTRAVENOUS | Status: DC | PRN

## 2015-11-11 MED ORDER — ONDANSETRON HCL 4 MG PO TABS: 4.0000 mg | ORAL_TABLET | Freq: Four times a day (QID) | ORAL | Status: DC | PRN

## 2015-11-11 MED ORDER — LIP MEDEX EX OINT
TOPICAL_OINTMENT | CUTANEOUS | Status: AC
  Administered 2015-11-11: 21:00:00
  Filled 2015-11-11: qty 7

## 2015-11-11 MED ORDER — ONDANSETRON HCL 4 MG/2ML IJ SOLN
4.0000 mg | Freq: Four times a day (QID) | INTRAMUSCULAR | Status: DC | PRN
  Administered 2015-11-12: 4 mg via INTRAVENOUS
  Filled 2015-11-11: qty 2

## 2015-11-11 MED ORDER — MIDAZOLAM HCL 2 MG/2ML IJ SOLN: 0.5000 mg | Freq: Once | INTRAMUSCULAR | Status: DC | PRN

## 2015-11-11 MED ORDER — DEXTROSE 5 % IV SOLN
500.0000 mg | Freq: Four times a day (QID) | INTRAVENOUS | Status: DC | PRN
  Administered 2015-11-11: 500 mg via INTRAVENOUS
  Filled 2015-11-11 (×2): qty 5

## 2015-11-11 MED ORDER — PHENOL 1.4 % MT LIQD
1.0000 | OROMUCOSAL | Status: DC | PRN
Start: 2015-11-11 — End: 2015-11-13

## 2015-11-11 MED ORDER — DEXAMETHASONE SODIUM PHOSPHATE 10 MG/ML IJ SOLN
10.0000 mg | Freq: Once | INTRAMUSCULAR | Status: AC
  Administered 2015-11-11: 10 mg via INTRAVENOUS

## 2015-11-11 MED ORDER — LACTATED RINGERS IV SOLN
INTRAVENOUS | Status: DC | PRN
  Administered 2015-11-11 (×3): via INTRAVENOUS

## 2015-11-11 MED ORDER — METOCLOPRAMIDE HCL 10 MG PO TABS: 5.0000 mg | ORAL_TABLET | Freq: Three times a day (TID) | ORAL | Status: DC | PRN

## 2015-11-11 MED ORDER — BUPIVACAINE HCL (PF) 0.75 % IJ SOLN
INTRAMUSCULAR | Status: DC | PRN
  Administered 2015-11-11: 15 mg

## 2015-11-11 MED ORDER — PROPOFOL 10 MG/ML IV BOLUS
INTRAVENOUS | Status: AC
  Filled 2015-11-11: qty 40

## 2015-11-11 MED ORDER — HYDROMORPHONE HCL 1 MG/ML IJ SOLN
INTRAMUSCULAR | Status: AC
  Filled 2015-11-11: qty 1

## 2015-11-11 MED ORDER — ACETAMINOPHEN 325 MG PO TABS: 650.0000 mg | ORAL_TABLET | Freq: Four times a day (QID) | ORAL | Status: DC | PRN

## 2015-11-11 MED ORDER — LIDOCAINE HCL (CARDIAC) 20 MG/ML IV SOLN
INTRAVENOUS | Status: DC | PRN
  Administered 2015-11-11: 50 mg via INTRAVENOUS

## 2015-11-11 MED ORDER — RIVAROXABAN 10 MG PO TABS
10.0000 mg | ORAL_TABLET | Freq: Every day | ORAL | Status: DC
  Administered 2015-11-12 – 2015-11-13 (×2): 10 mg via ORAL
  Filled 2015-11-11 (×4): qty 1

## 2015-11-11 MED ORDER — OXYCODONE HCL 5 MG PO TABS
5.0000 mg | ORAL_TABLET | ORAL | Status: DC | PRN
  Administered 2015-11-11: 5 mg via ORAL
  Administered 2015-11-12 (×3): 10 mg via ORAL
  Administered 2015-11-13: 5 mg via ORAL
  Filled 2015-11-11: qty 2
  Filled 2015-11-11 (×2): qty 1
  Filled 2015-11-11 (×2): qty 2

## 2015-11-11 MED ORDER — ONDANSETRON HCL 4 MG/2ML IJ SOLN
INTRAMUSCULAR | Status: DC | PRN
  Administered 2015-11-11: 4 mg via INTRAVENOUS

## 2015-11-11 MED ORDER — METHOCARBAMOL 500 MG PO TABS: 500.0000 mg | ORAL_TABLET | Freq: Four times a day (QID) | ORAL | Status: DC | PRN

## 2015-11-11 MED ORDER — PHENYLEPHRINE 40 MCG/ML (10ML) SYRINGE FOR IV PUSH (FOR BLOOD PRESSURE SUPPORT)
PREFILLED_SYRINGE | INTRAVENOUS | Status: AC
  Filled 2015-11-11: qty 10

## 2015-11-11 MED ORDER — TRAMADOL HCL 50 MG PO TABS
50.0000 mg | ORAL_TABLET | Freq: Four times a day (QID) | ORAL | Status: DC | PRN
  Administered 2015-11-13: 50 mg via ORAL
  Filled 2015-11-11: qty 1

## 2015-11-11 MED ORDER — CHLORHEXIDINE GLUCONATE 4 % EX LIQD: 60.0000 mL | Freq: Once | CUTANEOUS | Status: DC

## 2015-11-11 MED ORDER — BISACODYL 10 MG RE SUPP: 10.0000 mg | Freq: Every day | RECTAL | Status: DC | PRN

## 2015-11-11 MED ORDER — DEXAMETHASONE SODIUM PHOSPHATE 10 MG/ML IJ SOLN
INTRAMUSCULAR | Status: AC
  Filled 2015-11-11: qty 1

## 2015-11-11 MED ORDER — DOCUSATE SODIUM 100 MG PO CAPS
100.0000 mg | ORAL_CAPSULE | Freq: Two times a day (BID) | ORAL | Status: DC
  Administered 2015-11-11 – 2015-11-13 (×4): 100 mg via ORAL

## 2015-11-11 MED ORDER — METOCLOPRAMIDE HCL 5 MG/ML IJ SOLN: 5.0000 mg | Freq: Three times a day (TID) | INTRAMUSCULAR | Status: DC | PRN

## 2015-11-11 MED ORDER — FLEET ENEMA 7-19 GM/118ML RE ENEM: 1.0000 | ENEMA | Freq: Once | RECTAL | Status: DC | PRN

## 2015-11-11 MED ORDER — MEPERIDINE HCL 50 MG/ML IJ SOLN
INTRAMUSCULAR | Status: AC
  Filled 2015-11-11: qty 1

## 2015-11-11 MED ORDER — FAMOTIDINE 20 MG PO TABS
20.0000 mg | ORAL_TABLET | Freq: Two times a day (BID) | ORAL | Status: DC
  Administered 2015-11-11 – 2015-11-13 (×4): 20 mg via ORAL
  Filled 2015-11-11 (×5): qty 1

## 2015-11-11 MED ORDER — SODIUM CHLORIDE 0.9 % IV SOLN
10.0000 mg | INTRAVENOUS | Status: DC | PRN
  Administered 2015-11-11: 10 ug/min via INTRAVENOUS

## 2015-11-11 MED ORDER — ACETAMINOPHEN 10 MG/ML IV SOLN
INTRAVENOUS | Status: AC
  Filled 2015-11-11: qty 100

## 2015-11-11 MED ORDER — ACETAMINOPHEN 500 MG PO TABS
1000.0000 mg | ORAL_TABLET | Freq: Four times a day (QID) | ORAL | Status: AC
  Administered 2015-11-11 – 2015-11-12 (×4): 1000 mg via ORAL
  Filled 2015-11-11 (×5): qty 2

## 2015-11-11 MED ORDER — ACETAMINOPHEN 650 MG RE SUPP: 650.0000 mg | Freq: Four times a day (QID) | RECTAL | Status: DC | PRN

## 2015-11-11 MED ORDER — MIDAZOLAM HCL 2 MG/2ML IJ SOLN
INTRAMUSCULAR | Status: AC
  Filled 2015-11-11: qty 2

## 2015-11-11 MED ORDER — DEXAMETHASONE SODIUM PHOSPHATE 10 MG/ML IJ SOLN
10.0000 mg | Freq: Once | INTRAMUSCULAR | Status: AC
  Administered 2015-11-12: 10 mg via INTRAVENOUS
  Filled 2015-11-11: qty 1

## 2015-11-11 MED ORDER — STERILE WATER FOR IRRIGATION IR SOLN
Status: DC | PRN
  Administered 2015-11-11: 1000 mL

## 2015-11-11 MED ORDER — PHENYLEPHRINE HCL 10 MG/ML IJ SOLN
INTRAMUSCULAR | Status: AC
  Filled 2015-11-11: qty 1

## 2015-11-11 MED ORDER — TRANEXAMIC ACID 1000 MG/10ML IV SOLN
1000.0000 mg | INTRAVENOUS | Status: AC
  Administered 2015-11-11: 1000 mg via INTRAVENOUS
  Filled 2015-11-11: qty 10

## 2015-11-11 MED ORDER — BUPIVACAINE HCL (PF) 0.25 % IJ SOLN
INTRAMUSCULAR | Status: AC
  Filled 2015-11-11: qty 30

## 2015-11-11 MED ORDER — DIPHENHYDRAMINE HCL 12.5 MG/5ML PO ELIX: 12.5000 mg | ORAL_SOLUTION | ORAL | Status: DC | PRN

## 2015-11-11 SURGICAL SUPPLY — 38 items
BAG DECANTER FOR FLEXI CONT (MISCELLANEOUS) ×3 IMPLANT
BAG ZIPLOCK 12X15 (MISCELLANEOUS) ×3 IMPLANT
BLADE SAG 18X100X1.27 (BLADE) ×3 IMPLANT
CAPT HIP TOTAL 2 ×3 IMPLANT
CLOSURE WOUND 1/2 X4 (GAUZE/BANDAGES/DRESSINGS) ×2
CLOTH BEACON ORANGE TIMEOUT ST (SAFETY) ×3 IMPLANT
COVER PERINEAL POST (MISCELLANEOUS) ×3 IMPLANT
DECANTER SPIKE VIAL GLASS SM (MISCELLANEOUS) IMPLANT
DRAPE STERI IOBAN 125X83 (DRAPES) ×3 IMPLANT
DRAPE U-SHAPE 47X51 STRL (DRAPES) ×6 IMPLANT
DRSG ADAPTIC 3X8 NADH LF (GAUZE/BANDAGES/DRESSINGS) ×3 IMPLANT
DRSG MEPILEX BORDER 4X4 (GAUZE/BANDAGES/DRESSINGS) ×3 IMPLANT
DRSG MEPILEX BORDER 4X8 (GAUZE/BANDAGES/DRESSINGS) ×3 IMPLANT
DURAPREP 26ML APPLICATOR (WOUND CARE) ×3 IMPLANT
ELECT REM PT RETURN 9FT ADLT (ELECTROSURGICAL) ×3
ELECTRODE REM PT RTRN 9FT ADLT (ELECTROSURGICAL) ×1 IMPLANT
EVACUATOR 1/8 PVC DRAIN (DRAIN) ×3 IMPLANT
GLOVE BIO SURGEON STRL SZ7.5 (GLOVE) ×9 IMPLANT
GLOVE BIO SURGEON STRL SZ8 (GLOVE) ×6 IMPLANT
GLOVE BIOGEL PI IND STRL 6.5 (GLOVE) ×2 IMPLANT
GLOVE BIOGEL PI IND STRL 8 (GLOVE) ×2 IMPLANT
GLOVE BIOGEL PI INDICATOR 6.5 (GLOVE) ×4
GLOVE BIOGEL PI INDICATOR 8 (GLOVE) ×4
GLOVE ORTHOPEDIC STR SZ6.5 (GLOVE) ×3 IMPLANT
GLOVE SURG SS PI 6.5 STRL IVOR (GLOVE) ×3 IMPLANT
GOWN STRL REUS W/TWL LRG LVL3 (GOWN DISPOSABLE) ×3 IMPLANT
GOWN STRL REUS W/TWL XL LVL3 (GOWN DISPOSABLE) ×3 IMPLANT
PACK ANTERIOR HIP CUSTOM (KITS) ×3 IMPLANT
STRIP CLOSURE SKIN 1/2X4 (GAUZE/BANDAGES/DRESSINGS) ×4 IMPLANT
SUT ETHIBOND NAB CT1 #1 30IN (SUTURE) ×3 IMPLANT
SUT MNCRL AB 4-0 PS2 18 (SUTURE) ×3 IMPLANT
SUT VIC AB 2-0 CT1 27 (SUTURE) ×4
SUT VIC AB 2-0 CT1 TAPERPNT 27 (SUTURE) ×2 IMPLANT
SUT VLOC 180 0 24IN GS25 (SUTURE) ×3 IMPLANT
SYR 50ML LL SCALE MARK (SYRINGE) IMPLANT
TRAY FOLEY W/METER SILVER 14FR (SET/KITS/TRAYS/PACK) ×3 IMPLANT
TRAY FOLEY W/METER SILVER 16FR (SET/KITS/TRAYS/PACK) IMPLANT
YANKAUER SUCT BULB TIP 10FT TU (MISCELLANEOUS) ×3 IMPLANT

## 2015-11-11 NOTE — Anesthesia Procedure Notes (Signed)
Spinal Patient location during procedure: OR Start time: 11/11/2015 12:30 PM End time: 11/11/2015 12:37 PM Staffing Resident/CRNA: Durward Parcel A Performed by: resident/CRNA  Preanesthetic Checklist Completed: patient identified, site marked, surgical consent, pre-op evaluation, timeout performed, IV checked, risks and benefits discussed and monitors and equipment checked Spinal Block Patient position: sitting Prep: Betadine Patient monitoring: heart rate, cardiac monitor, continuous pulse ox and blood pressure Approach: midline Location: L4-5 Needle Needle type: Sprotte  Needle gauge: 24 G Needle length: 9 cm Needle insertion depth: 4 cm Assessment Sensory level: T8

## 2015-11-11 NOTE — Op Note (Signed)
OPERATIVE REPORT  PREOPERATIVE DIAGNOSIS: Osteoarthritis of the Right hip.   POSTOPERATIVE DIAGNOSIS: Osteoarthritis of the Right  hip.   PROCEDURE: Right total hip arthroplasty, anterior approach.   SURGEON: Ollen Gross, MD   ASSISTANT: Avel Peace, PA-C  ANESTHESIA:  Spinal  ESTIMATED BLOOD LOSS:-300 ml   DRAINS: Hemovac x1.   COMPLICATIONS: None   CONDITION: PACU - hemodynamically stable.   BRIEF CLINICAL NOTE: Amanda Washington is a 60 y.o. female who has advanced end-  stage arthritis of her Right  Hip secondary to dysplasia with progressively worsening pain and  dysfunction.The patient has failed nonoperative management and presents for  total hip arthroplasty.   PROCEDURE IN DETAIL: After successful administration of spinal  anesthetic, the traction boots for the [facility] bed were placed on both  feet and the patient was placed onto the [facility] bed, boots placed into the leg  holders. The Right hip was then isolated from the perineum with plastic  drapes and prepped and draped in the usual sterile fashion. ASIS and  greater trochanter were marked and a oblique incision was made, starting  at about 1 cm lateral and 2 cm distal to the ASIS and coursing towards  the anterior cortex of the femur. The skin was cut with a 10 blade  through subcutaneous tissue to the level of the fascia overlying the  tensor fascia lata muscle. The fascia was then incised in line with the  incision at the junction of the anterior third and posterior 2/3rd. The  muscle was teased off the fascia and then the interval between the TFL  and the rectus was developed. The Hohmann retractor was then placed at  the top of the femoral neck over the capsule. The vessels overlying the  capsule were cauterized and the fat on top of the capsule was removed.  A Hohmann retractor was then placed anterior underneath the rectus  femoris to give exposure to the entire anterior capsule. A T-shaped   capsulotomy was performed. The edges were tagged and the femoral head  was identified.       Osteophytes are removed off the superior acetabulum.  The femoral neck was then cut in situ with an oscillating saw. Traction  was then applied to the left lower extremity utilizing the [facility]  traction. The femoral head was then removed. Retractors were placed  around the acetabulum and then circumferential removal of the labrum was  performed. Osteophytes were also removed. Reaming starts at 45 mm to  medialize and  Increased in 2 mm increments to 49 mm. We reamed in  approximately 40 degrees of abduction, 20 degrees anteversion. A 50 mm  pinnacle acetabular shell was then impacted in anatomic position under  fluoroscopic guidance with excellent purchase. We did not need to place  any additional dome screws. A 32 mmneutral + 4 marathon liner was then  placed into the acetabular shell.       The femoral lift was then placed along the lateral aspect of the femur  just distal to the vastus ridge. The leg was  externally rotated and capsule  was stripped off the inferior aspect of the femoral neck down to the  level of the lesser trochanter, this was done with electrocautery. The femur was lifted after this was performed. The  leg was then placed and extended in adducted position to essentially delivering the femur. We also removed the capsule superiorly and the  piriformis from the  piriformis fossa to gain excellent exposure of the  proximal femur. Rongeur was used to remove some cancellous bone to get  into the lateral portion of the proximal femur for placement of the  initial starter reamer. The starter broaches was placed  the starter broach  and was shown to go down the center of the canal. Broaching  with the  Corail system was then performed starting at size 8, coursing  Up to size 12. A size 12 had excellent torsional and rotational  and axial stability. The trial standard offset neck was then  placed  with a 32 + 1 trial head. The hip was then reduced. We confirmed that  the stem was in the canal both on AP and lateral x-rays. It also has excellent sizing. The hip was reduced with outstanding stability through full extension, full external rotation,  and then flexion in adduction internal rotation. AP pelvis was taken  and the leg lengths were measured and found to be exactly equal. Hip  was then dislocated again and the femoral head and neck removed. The  femoral broach was removed. Size 12 Corail stem with a standard offset  neck was then impacted into the femur following native anteversion. Has  excellent purchase in the canal. Excellent torsional and rotational and  axial stability. It is confirmed to be in the canal on AP and lateral  fluoroscopic views. The 32 + 1 ceramic head was placed and the hip  reduced with outstanding stability. Again AP pelvis was taken and it  confirmed that the leg lengths were equal. The wound was then copiously  irrigated with saline solution and the capsule reattached and repaired  with Ethibond suture. 30 ml of .25% Bupivicaine injected into the capsule and into the edge of the tensor fascia lata as well as subcutaneous tissue. The fascia overlying the tensor fascia lata was  then closed with a running #1 V-Loc. Subcu was closed with interrupted  2-0 Vicryl and subcuticular running 4-0 Monocryl. Incision was cleaned  and dried. Steri-Strips and a bulky sterile dressing applied. Hemovac  drain was hooked to suction and then he was awakened and transported to  recovery in stable condition.        Please note that a surgical assistant was a medical necessity for this procedure to perform it in a safe and expeditious manner. Assistant was necessary to provide appropriate retraction of vital neurovascular structures and to prevent femoral fracture and allow for anatomic placement of the prosthesis.  Ollen Gross, M.D.

## 2015-11-11 NOTE — H&P (View-Only) (Signed)
Amanda Washington DOB: 08/24/1956 Divorced / Language: English / Race: White Female Date of Admission:  11/11/2015 CC:  Right Hip Pain History of Present Illness The patient is a 60 year old female who comes in for a preoperative History and Physical. The patient is scheduled for a right total hip arthroplasty (anterior) to be performed by Dr. Frank V. Aluisio, MD at [facility] on 11-11-2015. The patient is a 60 year old female who presents today for follow up of their hip. The patient is being followed for their right and left hip pain and osteoarthritis. They are 2 year(s) out from when symptoms began. Symptoms reported today include: pain, aching and throbbing. The patient feels that they are doing poorly and report their pain level to be moderate to severe. The following medication has been used for pain control: antiinflammatory medication (aleve). She had an injury at work 2 years ago injuring her left foot. She had to wear a boot for several weeks. She started to notice pain in the right hip after that. her left hip has started bothering her more in the last few months. She has had to make significant modifications in her activity over the past couple years. 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 She has not had cortisone injections before. She has pain in the left hip but not as much issue with stiffness as in the right. She is interested in having the right hip replaced and perhaps having a cortisone injection in the left hip. Unfortunately, the right hip has gotten progressively worse. It is bothering her at all times now. Occasionally, will wake her up at night. It is limiting what she can and cannot do. She has less lateral mobility. She is unable to even do regular activities of daily living because of the hip. They have been treated conservatively in the past for the above stated problem and despite  conservative measures, they continue to have progressive pain and severe functional limitations and dysfunction. They have failed non-operative management including home exercise, medications. It is felt that they would benefit from undergoing total joint replacement. Risks and benefits of the procedure have been discussed with the patient and they elect to proceed with surgery. There are no active contraindications to surgery such as ongoing infection or rapidly progressive neurological disease.  Problem List/Past Medical Pain of left hip joint (M25.552)  Primary localized osteoarthritis of right hip (M16.11)  Migraine Headache  Hypertension  Menopause  Measles  Mumps  Allergies  Amoxicillin *PENICILLINS*  Hives. ? Reaction  Family History  Osteoarthritis  Father, Paternal Grandmother. Cancer  Father, Maternal Grandmother.  Social History No history of drug/alcohol rehab  Marital status  divorced Tobacco use  Never smoker. 03/04/2015 Not under pain contract  Living situation  live alone Current drinker  03/04/2015: Currently drinks beer, wine and hard liquor only occasionally per week Children  0 Exercise  Exercises rarely; does running / walking Current work status  working full time Post-Surgical Plans  Home versus Rehab (Patient lives alone).  Medication History Vitamin D2 (Oral) Specific strength unknown - Active. Aleve (220MG Tablet, Oral) Active.  Past Surgical History  Appendectomy  Colonoscopy  Wisdom Teeth Extraction    Review of Systems General Not Present- Chills, Fatigue, Fever, Memory Loss, Night Sweats, Weight Gain and Weight Loss. Skin Not Present- Eczema, Hives, Itching, Lesions and Rash. HEENT Not Present- Dentures, Double Vision, Headache,   Hearing Loss, Tinnitus and Visual Loss. Respiratory Not Present- Allergies, Chronic Cough, Coughing up blood, Shortness of breath at rest and Shortness of breath with exertion. Cardiovascular  Not Present- Chest Pain, Difficulty Breathing Lying Down, Murmur, Palpitations, Racing/skipping heartbeats and Swelling. Gastrointestinal Present- Constipation. Not Present- Abdominal Pain, Bloody Stool, Diarrhea, Difficulty Swallowing, Heartburn, Jaundice, Loss of appetitie, Nausea and Vomiting. Female Genitourinary Not Present- Blood in Urine, Discharge, Flank Pain, Incontinence, Painful Urination, Urgency, Urinary frequency, Urinary Retention, Urinating at Night and Weak urinary stream. Musculoskeletal Present- Joint Pain, Morning Stiffness and Muscle Weakness. Not Present- Back Pain, Joint Swelling, Muscle Pain and Spasms. Neurological Not Present- Blackout spells, Difficulty with balance, Dizziness, Paralysis, Tremor and Weakness. Psychiatric Not Present- Insomnia.  Vitals Weight: 175 lb Height: 70.5in Weight was reported by patient. Height was reported by patient. Body Surface Area: 1.98 m Body Mass Index: 24.75 kg/m  BP: 132/78 (Sitting, Left Arm, Standard)  Physical Exam General Mental Status -Alert, cooperative and good historian. General Appearance-pleasant, Not in acute distress. Orientation-Oriented X3. Build & Nutrition-Well nourished and Well developed.  Head and Neck Head-normocephalic, atraumatic . Neck Global Assessment - supple, no bruit auscultated on the right, no bruit auscultated on the left.  Eye Vision-Wears corrective lenses. Pupil - Bilateral-Regular and Round. Motion - Bilateral-EOMI.  Chest and Lung Exam Auscultation Breath sounds - clear at anterior chest wall and clear at posterior chest wall. Adventitious sounds - No Adventitious sounds.  Cardiovascular Auscultation Rhythm - Regular rate and rhythm. Heart Sounds - S1 WNL and S2 WNL. Murmurs & Other Heart Sounds - Auscultation of the heart reveals - No Murmurs.  Abdomen Palpation/Percussion Tenderness - Abdomen is non-tender to palpation. Rigidity (guarding) - Abdomen is  soft. Auscultation Auscultation of the abdomen reveals - Bowel sounds normal.  Female Genitourinary Note: Not done, not pertinent to present illness   Musculoskeletal Note: Well developed female, in no distress. Right hip can be flexed to 100, no internal rotation, about 10 external rotation, 10 to 20 of abduction. Left hip flexion about 120, rotation in 10, out 30, abduction 30 with slight discomfort. Knee show no effusion. There was crepitus on range of motion of both knees. There is no joint line tenderness or instability. Pulse, sensation and motor intact distally.  RADIOGRAPHS AP pelvis and lateral hips reviewed today. She has got bone on bone arthritis of the right hip with subchondral cystic formation and with dysplastic changes. 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   Assessment & Plan Primary localized osteoarthritis of right hip (M16.11)  Note:Surgical Plans: Right Total Hip Replacement - Anterior Approach  Disposition: Home versus Rehab  PCP: Dr. Nyland - patient has been seen and given a verbal clearance.  IV TXA  Anesthesia Issues: None  Signed electronically by Ely Spragg L Dequante Tremaine, III PA-C 

## 2015-11-11 NOTE — Anesthesia Preprocedure Evaluation (Addendum)
Anesthesia Evaluation  Patient identified by MRN, date of birth, ID band Patient awake    Reviewed: Allergy & Precautions, NPO status , Patient's Chart, lab work & pertinent test results  History of Anesthesia Complications (+) PONV and history of anesthetic complications (with ether)  Airway Mallampati: I  TM Distance: >3 FB Neck ROM: Full    Dental  (+) Caps, Dental Advisory Given   Pulmonary neg pulmonary ROS,    breath sounds clear to auscultation       Cardiovascular negative cardio ROS   Rhythm:Regular Rate:Normal     Neuro/Psych negative neurological ROS     GI/Hepatic Neg liver ROS, GERD  Medicated and Controlled,  Endo/Other  negative endocrine ROS  Renal/GU negative Renal ROS     Musculoskeletal  (+) Arthritis , Osteoarthritis,    Abdominal   Peds  Hematology negative hematology ROS (+)   Anesthesia Other Findings   Reproductive/Obstetrics                            Anesthesia Physical Anesthesia Plan  ASA: II  Anesthesia Plan: Spinal   Post-op Pain Management:    Induction:   Airway Management Planned: Natural Airway and Simple Face Mask  Additional Equipment:   Intra-op Plan:   Post-operative Plan:   Informed Consent: I have reviewed the patients History and Physical, chart, labs and discussed the procedure including the risks, benefits and alternatives for the proposed anesthesia with the patient or authorized representative who has indicated his/her understanding and acceptance.   Dental advisory given  Plan Discussed with: CRNA and Surgeon  Anesthesia Plan Comments: (Plan routine monitors, SAB)        Anesthesia Quick Evaluation

## 2015-11-11 NOTE — Interval H&P Note (Signed)
History and Physical Interval Note:  11/11/2015 12:12 PM  Amanda Washington  has presented today for surgery, with the diagnosis of RIGHT HIP OA  The various methods of treatment have been discussed with the patient and family. After consideration of risks, benefits and other options for treatment, the patient has consented to  Procedure(s): RIGHT TOTAL HIP ARTHROPLASTY ANTERIOR APPROACH (Right) as a surgical intervention .  The patient's history has been reviewed, patient examined, no change in status, stable for surgery.  I have reviewed the patient's chart and labs.  Questions were answered to the patient's satisfaction.     Loanne Drilling

## 2015-11-11 NOTE — Transfer of Care (Signed)
Immediate Anesthesia Transfer of Care Note  Patient: Amanda Washington  Procedure(s) Performed: Procedure(s): RIGHT TOTAL HIP ARTHROPLASTY ANTERIOR APPROACH (Right)  Patient Location: PACU  Anesthesia Type:Spinal  Level of Consciousness: awake, alert , oriented and patient cooperative  Airway & Oxygen Therapy: Patient Spontanous Breathing and Patient connected to nasal cannula oxygen  Post-op Assessment: Report given to RN and Post -op Vital signs reviewed and stable  Post vital signs: Reviewed and stable  Last Vitals:  Filed Vitals:   11/11/15 0916  BP: 142/78  Pulse: 95  Temp: 36.7 C  Resp: 18    Complications: No apparent anesthesia complications

## 2015-11-11 NOTE — Anesthesia Postprocedure Evaluation (Signed)
Anesthesia Post Note  Patient: Amanda Washington  Procedure(s) Performed: Procedure(s) (LRB): RIGHT TOTAL HIP ARTHROPLASTY ANTERIOR APPROACH (Right)  Patient location during evaluation: PACU Anesthesia Type: Spinal Level of consciousness: awake and alert, oriented and patient cooperative Pain management: pain level controlled Vital Signs Assessment: post-procedure vital signs reviewed and stable Respiratory status: spontaneous breathing, nonlabored ventilation and respiratory function stable Cardiovascular status: blood pressure returned to baseline and stable Postop Assessment: no signs of nausea or vomiting, no headache, no backache, spinal receding and patient able to bend at knees Anesthetic complications: no    Last Vitals:  Filed Vitals:   11/11/15 1611 11/11/15 1712  BP: 132/67 129/72  Pulse: 78 88  Temp: 37.2 C 37.1 C  Resp: 12 16    Last Pain:  Filed Vitals:   11/11/15 1717  PainSc: 0-No pain                 Rasul Decola,E. [place]

## 2015-11-12 LAB — CBC
HCT: 28.9 % — ABNORMAL LOW (ref 36.0–46.0)
Hemoglobin: 9.4 g/dL — ABNORMAL LOW (ref 12.0–15.0)
MCH: 29.6 pg (ref 26.0–34.0)
MCHC: 32.5 g/dL (ref 30.0–36.0)
MCV: 90.9 fL (ref 78.0–100.0)
PLATELETS: 241 10*3/uL (ref 150–400)
RBC: 3.18 MIL/uL — AB (ref 3.87–5.11)
RDW: 13.1 % (ref 11.5–15.5)
WBC: 9.2 10*3/uL (ref 4.0–10.5)

## 2015-11-12 LAB — BASIC METABOLIC PANEL
ANION GAP: 8 (ref 5–15)
BUN: 12 mg/dL (ref 6–20)
CO2: 26 mmol/L (ref 22–32)
Calcium: 8.8 mg/dL — ABNORMAL LOW (ref 8.9–10.3)
Chloride: 107 mmol/L (ref 101–111)
Creatinine, Ser: 0.77 mg/dL (ref 0.44–1.00)
GFR calc Af Amer: >60 mL/min (ref >60)
Glucose, Bld: 139 mg/dL — ABNORMAL HIGH (ref 65–99)
POTASSIUM: 4.3 mmol/L (ref 3.5–5.1)
SODIUM: 141 mmol/L (ref 135–145)

## 2015-11-12 NOTE — Evaluation (Signed)
Occupational Therapy Evaluation Patient Details Name: Amanda Washington MRN: [id number] DOB: [birth year] Today's Date: 11/12/2015    History of Present Illness s/p R DA THA   Clinical Impression   This 60 year old female was admitted for the above surgery. She will benefit from OT in acute to increase safety and independence with adls.  Goals in acute are for supervision level.  Pt currently needs up to max A for LB adls.  She was independent prior to admission    Follow Up Recommendations  SNF    Equipment Recommendations   (may be able to borrow 3:1 commode)    Recommendations for Other Services       Precautions / Restrictions Precautions Precautions: Fall Restrictions Weight Bearing Restrictions: No      Mobility Bed Mobility Overal bed mobility: Needs Assistance Bed Mobility: Supine to Sit     Supine to sit: Min assist     General bed mobility comments: assist for RLE and cues for sequence  Transfers Overall transfer level: Needs assistance Equipment used: Rolling walker (2 wheeled) Transfers: Sit to/from Stand Sit to Stand: Min assist         General transfer comment: assist to rise and steady. Cues for UE/LE placement    Balance                                            ADL Overall ADL's : Needs assistance/impaired     Grooming: Supervision/safety;Bed level   Upper Body Bathing: Set up;Sitting   Lower Body Bathing: Moderate assistance;Sit to/from stand   Upper Body Dressing : Minimal assistance;Sitting (lines)   Lower Body Dressing: Maximal assistance;Sit to/from stand   Toilet Transfer: Minimal assistance;Ambulation;BSC;RW   Toileting- Clothing Manipulation and Hygiene: Minimal assistance;Sit to/from stand         General ADL Comments: performed ADL and ambulated to bathroom to use toilet.  Educated on AE, but pt did not use this session.  She will borrow a Cabin crew       Pertinent Vitals/Pain Pain Assessment: 0-10 Pain Score: 5  Pain Location: R hip Pain Descriptors / Indicators: Aching Pain Intervention(s): Limited activity within patient's tolerance;Monitored during session;Premedicated before session;Repositioned (removed ice)     Hand Dominance     Extremity/Trunk Assessment Upper Extremity Assessment Upper Extremity Assessment: Overall WFL for tasks assessed           Communication Communication Communication: No difficulties   Cognition Arousal/Alertness: Awake/alert Behavior During Therapy: WFL for tasks assessed/performed Overall Cognitive Status: Within Functional Limits for tasks assessed                     General Comments       Exercises       Shoulder Instructions      Home Living Family/patient expects to be discharged to:: Private residence Living Arrangements: Alone Available Help at Discharge: Family               Bathroom Shower/Tub: Walk-in Human resources officer: Standard         Additional Comments: may be able to borrow 3:1 commode      Prior Functioning/Environment Level of Independence: Independent             OT Diagnosis: Acute pain  OT Problem List: Decreased strength;Decreased activity tolerance;Decreased knowledge of use of DME or AE;Pain   OT Treatment/Interventions: Self-care/ADL training;DME and/or AE instruction;Patient/family education    OT Goals(Current goals can be found in the care plan section) Acute Rehab OT Goals Patient Stated Goal: get back to being independent OT Goal Formulation: With patient Time For Goal Achievement: 11/19/15 Potential to Achieve Goals: Good ADL Goals Pt Will Perform Lower Body Bathing: with supervision;sit to/from stand Pt Will Perform Lower Body Dressing: with supervision;with adaptive equipment;sit to/from stand Pt Will Transfer to Toilet: with supervision;ambulating;bedside commode Pt Will Perform Toileting - Clothing  Manipulation and hygiene: with supervision;sit to/from stand Pt Will Perform Tub/Shower Transfer: Shower transfer;ambulating;3 in 1  OT Frequency: Min 2X/week   Barriers to D/C:            Co-evaluation              End of Session    Activity Tolerance: Patient tolerated treatment well Patient left: in chair;with call bell/phone within reach   Time: 1007-1047 OT Time Calculation (min): 40 min Charges:  OT General Charges $OT Visit: 1 Procedure OT Evaluation $OT Eval Low Complexity: 1 Procedure OT Treatments $Self Care/Home Management : 8-22 mins G-Codes:    Jensine Luz 2015/11/15, 10:58 AM  Marica Otter, OTR/L [phone] 11-15-2015

## 2015-11-12 NOTE — Progress Notes (Signed)
   Subjective: 1 Day Post-Op Procedure(s) (LRB): RIGHT TOTAL HIP ARTHROPLASTY ANTERIOR APPROACH (Right) Patient reports pain as mild.   Patient seen in rounds with Dr. Lequita Halt. Patient is well, but has had some minor complaints of pain in the hip, requiring pain medications We will start therapy today.  Plan is to go Skilled nursing facility after hospital stay.  Objective: Vital signs in last 24 hours: Temp:  [97.7 F (36.5 C)-99 F (37.2 C)] 98 F (36.7 C) (01/26 0619) Pulse Rate:  [68-95] 92 (01/26 0619) Resp:  [11-18] 16 (01/26 0619) BP: (105-142)/(53-81) 122/64 mmHg (01/26 0619) SpO2:  [99 %-100 %] 100 % (01/26 0619) Weight:  [79.833 kg (176 lb)] 79.833 kg (176 lb) (01/25 0934)  Intake/Output from previous day:  Intake/Output Summary (Last 24 hours) at 11/12/15 0757 Last data filed at 11/12/15 0710  Gross per 24 hour  Intake   5888 ml  Output   3745 ml  Net   2143 ml    Intake/Output this shift: Total I/O In: 121.3 [P.O.:60; I.V.:61.3] Out: -   Labs:  Recent Labs  11/12/15 0420  HGB 9.4*    Recent Labs  11/12/15 0420  WBC 9.2  RBC 3.18*  HCT 28.9*  PLT 241    Recent Labs  11/12/15 0420  NA 141  K 4.3  CL 107  CO2 26  BUN 12  CREATININE 0.77  GLUCOSE 139*  CALCIUM 8.8*   No results for input(s): LABPT, INR in the last 72 hours.  EXAM General - Patient is Alert, Appropriate and Oriented Extremity - Neurovascular intact Sensation intact distally Dorsiflexion/Plantar flexion intact Dressing - dressing C/D/I Motor Function - intact, moving foot and toes well on exam.  Hemovac pulled without difficulty.  Past Medical History  Diagnosis Date  . IBS (irritable bowel syndrome)   . Fracture of foot 08/2013    left  . Hx of seasonal allergies   . Migraine headache     better since off OCP 07/2010. no longer a problem after menopause  . Arthritis     osteoarthritis- hip ,bilateral  . Bleeding nose     freq occ. -during childhood and  related to sinus infection or dryness-less frequent now.  . Complication of anesthesia     Nausea and vomiting w Ether  . PONV (postoperative nausea and vomiting)     Assessment/Plan: 1 Day Post-Op Procedure(s) (LRB): RIGHT TOTAL HIP ARTHROPLASTY ANTERIOR APPROACH (Right) Principal Problem:   OA (osteoarthritis) of hip  Estimated body mass index is 25.25 kg/(m^2) as calculated from the following:   Height as of this encounter:  (1.778 m).   Weight as of this encounter: 79.833 kg (176 lb). Advance diet Up with therapy Plan for discharge tomorrow Discharge to SNF  DVT Prophylaxis - Xarelto Weight Bearing As Tolerated right Leg Hemovac Pulled Begin Therapy  Avel Peace, PA-C Orthopaedic Surgery 11/12/2015, 7:57 AM

## 2015-11-12 NOTE — Clinical Social Work Placement (Signed)
   CLINICAL SOCIAL WORK PLACEMENT  NOTE  Date:  11/12/2015  Patient Details  Name: Amanda Washington MRN: [id number] Date of Birth: 1955/12/11  Clinical Social Work is seeking post-discharge placement for this patient at the Skilled  Nursing Facility level of care (*CSW will initial, date and re-position this form in  chart as items are completed):  Yes   Patient/family provided with [facility] Clinical Social Work Department's list of facilities offering this level of care within the geographic area requested by the patient (or if unable, by the patient's family).  Yes   Patient/family informed of their freedom to choose among providers that offer the needed level of care, that participate in Medicare, Medicaid or managed care program needed by the patient, have an available bed and are willing to accept the patient.  Yes   Patient/family informed of [facility]'s ownership interest in [facility] and [facility], as well as of the fact that they are under no obligation to receive care at these facilities.  PASRR submitted to EDS on 11/12/15     PASRR number received on 11/12/15     Existing PASRR number confirmed on       FL2 transmitted to all facilities in geographic area requested by pt/family on 11/12/15     FL2 transmitted to all facilities within larger geographic area on       Patient informed that his/her managed care company has contracts with or will negotiate with certain facilities, including the following:            Patient/family informed of bed offers received.  Patient chooses bed at       Physician recommends and patient chooses bed at      Patient to be transferred to   on  .  Patient to be transferred to facility by       Patient family notified on   of transfer.  Name of family member notified:        PHYSICIAN       Additional Comment:    _______________________________________________ Royetta Asal, LCSW  [phone] 11/12/2015,  2:30 PM

## 2015-11-12 NOTE — NC FL2 (Signed)
[place] MEDICAID FL2 LEVEL OF CARE SCREENING TOOL     IDENTIFICATION  Patient Name: Amanda Washington Birthdate: 01/08/1956 Sex: female Admission Date (Current Location): 11/11/2015  [facility] and [place] Number:  Geophysical data processor and Address:  [facility],  [address], [place] [address]      Provider Number: [id number]  Attending Physician Name and Address:  Ollen Gross, MD  Relative Name and Phone Number:       Current Level of Care: Hospital Recommended Level of Care: Skilled Nursing Facility Prior Approval Number:    Date Approved/Denied:   PASRR Number: [id number]  Discharge Plan: SNF    Current Diagnoses: Patient Active Problem List   Diagnosis Date Noted  . OA (osteoarthritis) of hip 11/11/2015    Orientation RESPIRATION BLADDER Height & Weight    Self, Time, Situation, Place  Normal Continent  (177.8 cm) 176 lbs.  BEHAVIORAL SYMPTOMS/MOOD NEUROLOGICAL BOWEL NUTRITION STATUS  Other (Comment) (no behaviors)   Continent Diet  AMBULATORY STATUS COMMUNICATION OF NEEDS Skin   Limited Assist Verbally Surgical wounds                       Personal Care Assistance Level of Assistance  Bathing, Feeding, Dressing Bathing Assistance: Limited assistance Feeding assistance: Independent Dressing Assistance: Limited assistance     Functional Limitations Info  Sight, Hearing, Speech Sight Info: Adequate Hearing Info: Adequate Speech Info: Adequate    SPECIAL CARE FACTORS FREQUENCY  PT (By licensed PT), OT (By licensed OT)     PT Frequency: 5x wk OT Frequency: 5x wk            Contractures Contractures Info: Not present    Additional Factors Info  Code Status Code Status Info: Full Code             Current Medications (11/12/2015):  This is the current hospital active medication list Current Facility-Administered Medications  Medication Dose Route Frequency Provider Last Rate Last Dose  . 0.9 % NaCl with  KCl 20 mEq/ L  infusion   Intravenous Continuous Avel Peace, PA-C 75 mL/hr at 11/11/15 1742    . acetaminophen (TYLENOL) tablet 650 mg  650 mg Oral Q6H PRN Ollen Gross, MD       Or  . acetaminophen (TYLENOL) suppository 650 mg  650 mg Rectal Q6H PRN Ollen Gross, MD      . acetaminophen (TYLENOL) tablet 1,000 mg  1,000 mg Oral 4 times per day Ollen Gross, MD   1,000 mg at 11/12/15 5621  . bisacodyl (DULCOLAX) suppository 10 mg  10 mg Rectal Daily PRN Ollen Gross, MD      . diphenhydrAMINE (BENADRYL) 12.5 MG/5ML elixir 12.5-25 mg  12.5-25 mg Oral Q4H PRN Ollen Gross, MD      . docusate sodium (COLACE) capsule 100 mg  100 mg Oral BID Ollen Gross, MD   100 mg at 11/12/15 0914  . famotidine (PEPCID) tablet 20 mg  20 mg Oral BID Ollen Gross, MD   20 mg at 11/12/15 0914  . menthol-cetylpyridinium (CEPACOL) lozenge 3 mg  1 lozenge Oral PRN Ollen Gross, MD       Or  . phenol (CHLORASEPTIC) mouth spray 1 spray  1 spray Mouth/Throat PRN Ollen Gross, MD      . methocarbamol (ROBAXIN) tablet 500 mg  500 mg Oral Q6H PRN Ollen Gross, MD       Or  . methocarbamol (ROBAXIN) 500 mg in dextrose 5 %  50 mL IVPB  500 mg Intravenous Q6H PRN Ollen Gross, MD   500 mg at 11/11/15 1455  . metoCLOPramide (REGLAN) tablet 5-10 mg  5-10 mg Oral Q8H PRN Ollen Gross, MD       Or  . metoCLOPramide (REGLAN) injection 5-10 mg  5-10 mg Intravenous Q8H PRN Ollen Gross, MD      . morphine 2 MG/ML injection 1 mg  1 mg Intravenous Q2H PRN Ollen Gross, MD      . ondansetron [facility]) tablet 4 mg  4 mg Oral Q6H PRN Ollen Gross, MD       Or  . ondansetron [facility]) injection 4 mg  4 mg Intravenous Q6H PRN Ollen Gross, MD   4 mg at 11/12/15 0914  . oxyCODONE (Oxy IR/ROXICODONE) immediate release tablet 5-10 mg  5-10 mg Oral Q3H PRN Ollen Gross, MD   10 mg at 11/12/15 1610  . polyethylene glycol (MIRALAX / GLYCOLAX) packet 17 g  17 g Oral Daily PRN Ollen Gross, MD      . rivaroxaban Carlena Hurl) tablet 10  mg  10 mg Oral Q breakfast Ollen Gross, MD   10 mg at 11/12/15 0914  . sodium phosphate (FLEET) 7-19 GM/118ML enema 1 enema  1 enema Rectal Once PRN Ollen Gross, MD      . traMADol Janean Sark) tablet 50-100 mg  50-100 mg Oral Q6H PRN Ollen Gross, MD         Discharge Medications: Please see discharge summary for a list of discharge medications.  Relevant Imaging Results:  Relevant Lab Results:   Additional Information SS # [id number]  Joylynn Defrancesco, Dickey Gave, LCSW

## 2015-11-12 NOTE — Progress Notes (Signed)
Utilization review completed.  

## 2015-11-12 NOTE — Clinical Social Work Note (Signed)
Clinical Social Work Assessment  Patient Details  Name: Amanda Washington MRN: [id number] Date of Birth: [birth year]  Date of referral:  11/12/15               Reason for consult:  Facility Placement, Discharge Planning                Permission sought to share information with:  Chartered certified accountant granted to share information::  Yes, Verbal Permission Granted  Name::        Agency::     Relationship::     Contact Information:     Housing/Transportation Living arrangements for the past 2 months:  Single Family Home Source of Information:  Patient Patient Interpreter Needed:  None Criminal Activity/Legal Involvement Pertinent to Current Situation/Hospitalization:  No - Comment as needed Significant Relationships:  Friend Lives with:  Self Do you feel safe going back to the place where you live?  No (SNF is recommended.) Need for family participation in patient care:  No (Coment)  Care giving concerns:  Pt's care cannot be managed at home following hospital d/c.   Social Worker assessment / plan: Pt hospitalized on 11/11/15 for pre planned right total hip arthroplasty. CSW met with pt to assist with d/c planning. PT has recommended ST Rehab at d/c. Pt has requested [facility] for rehab. Clinicals have been sent to SNF and SNF has offered placement pending BCBS authorization. CSW will continue to follow and assist with d/c planning needs.  Employment status:  Therapist, music:  Managed Care PT Recommendations:  [place] / Referral to community resources:  [place]  Patient/Family's Response to care: Pt feels ST Rehab is needed.  Patient/Family's Understanding of and Emotional Response to Diagnosis, Current Treatment, and Prognosis:  Pt is aware of her medical status. She is motivated to work with therapy and is hopeful insurance will authorize ST Rehab at [facility].  Emotional  Assessment Appearance:  Appears stated age Attitude/Demeanor/Rapport:  Other (cooperative) Affect (typically observed):  Calm, Pleasant Orientation:  Oriented to Self, Oriented to Place, Oriented to  Time, Oriented to Situation Alcohol / Substance use:  Not Applicable Psych involvement (Current and /or in the community):  No (Comment)  Discharge Needs  Concerns to be addressed:  Discharge Planning Concerns Readmission within the last 30 days:  No Current discharge risk:  None Barriers to Discharge:  No Barriers Identified   Luretha Rued, [place] 11/12/2015, 2:23 PM

## 2015-11-12 NOTE — Evaluation (Signed)
Physical Therapy Evaluation Patient Details Name: Amanda Washington MRN: [id number] DOB: 09/27/1956 Today's Date: 11/12/2015   History of Present Illness  s/p R DA THA  Clinical Impression  Patient reports feeling light headed when up earlier. BP in chair 114/74, sitting upright, 108/58. Sat a few more minutes w/ pressure 117/59. Patient did tolerate ambulation, slowly with 2 sops for  Mild dizziness. Patient will benefit from PT  While in acute care to address the problems listed in the note below to DC to next venue. Recommend short term rehab to return to safe modified independent level.    Follow Up Recommendations SNF;Supervision/Assistance - 24 hour    Equipment Recommendations  None recommended by PT    Recommendations for Other Services       Precautions / Restrictions Precautions Precautions: Fall Precaution Comments: monitor BP Restrictions Weight Bearing Restrictions: No      Mobility  Bed Mobility Overal bed mobility: Needs Assistance Bed Mobility: Sit to Supine     Supine to sit: Min assist Sit to supine: Mod assist   General bed mobility comments: cues for position on the bed, mod assist for the R leg onto the bed  Transfers Overall transfer level: Needs assistance Equipment used: Rolling walker (2 wheeled) Transfers: Sit to/from Stand Sit to Stand: Min assist         General transfer comment: multimodal cues for hand and R leg position for sit to stand to sit.  Ambulation/Gait Ambulation/Gait assistance: Min assist Ambulation Distance (Feet): 90 Feet Assistive device: Rolling walker (2 wheeled) Gait Pattern/deviations: Step-to pattern;Antalgic;Decreased step length - right;Decreased stance time - right     General Gait Details: cues for sequence and position inside of the RW, cues for safe turning.  Stairs            Wheelchair Mobility    Modified Rankin (Stroke Patients Only)       Balance                                              Pertinent Vitals/Pain Pain Assessment: 0-10 Pain Score: 5  Pain Location: R hip and thigh Pain Descriptors / Indicators: Aching;Tightness;Cramping Pain Intervention(s): Limited activity within patient's tolerance;Monitored during session;Premedicated before session;Ice applied;Repositioned    Home Living Family/patient expects to be discharged to:: Private residence Living Arrangements: Alone Available Help at Discharge: Family Type of Home: House Home Access: Stairs to enter Entrance Stairs-Rails: Doctor, general practice of Steps: 4 Home Layout: One level Home Equipment: Environmental consultant - 2 wheels;Cane - single point Additional Comments: may be able to borrow 3:1 commode    Prior Function Level of Independence: Independent with assistive device(s)         Comments: has had to use the cane , especially on incline to get to her 4 steps and door.     Hand Dominance        Extremity/Trunk Assessment   Upper Extremity Assessment: Overall WFL for tasks assessed           Lower Extremity Assessment: RLE deficits/detail RLE Deficits / Details: advances the leg slowly    Cervical / Trunk Assessment: Normal  Communication   Communication: No difficulties  Cognition Arousal/Alertness: Awake/alert Behavior During Therapy: WFL for tasks assessed/performed Overall Cognitive Status: Within Functional Limits for tasks assessed  General Comments      Exercises        Assessment/Plan    PT Assessment Patient needs continued PT services  PT Diagnosis Difficulty walking;Acute pain   PT Problem List Decreased strength;Decreased range of motion;Decreased activity tolerance;Decreased mobility;Cardiopulmonary status limiting activity;Decreased knowledge of precautions;Decreased safety awareness;Decreased knowledge of use of DME;Pain  PT Treatment Interventions DME instruction;Gait training;Functional mobility  training;Stair training;Therapeutic activities;Patient/family education   PT Goals (Current goals can be found in the Care Plan section) Acute Rehab PT Goals Patient Stated Goal: get back to being independent PT Goal Formulation: With patient Time For Goal Achievement: 11/19/15 Potential to Achieve Goals: Good    Frequency 7X/week   Barriers to discharge Decreased caregiver support;Inaccessible home environment patient has an incline leading to the 4  steps to enter    Co-evaluation               End of Session   Activity Tolerance: Treatment limited secondary to medical complications (Comment) (limited by dizziness and  mild nausea.) Patient left: in bed;with call bell/phone within reach Nurse Communication: Mobility status         Time: 9147-8295 PT Time Calculation (min) (ACUTE ONLY): 32 min   Charges:   PT Evaluation $PT Eval Low Complexity: 1 Procedure PT Treatments $Gait Training: 8-22 mins   PT G Codes:        Rada Hay 11/12/2015, 12:07 PM Blanchard Kelch PT [phone]

## 2015-11-12 NOTE — Progress Notes (Signed)
Physical Therapy Treatment Patient Details Name: Enrique Weiss MRN: [id number] DOB: 06/14/1956 Today's Date: 11/12/2015    History of Present Illness s/p R DA THA    PT Comments    POD # 1 pm session.  Pt continues to be unsteady with her gait and limited on her activity tolerance with c/o feeling "woozy" and "tired".  Assisted with amb then back to bed to perform THR TE's mostly AAROM followed by ICE.  Pt progressing slowly and will need ST Rehab prior to returning home alone.    Follow Up Recommendations  SNF     Equipment Recommendations  None recommended by PT    Recommendations for Other Services       Precautions / Restrictions Precautions Precautions: Fall Precaution Comments: monitor BP Restrictions Weight Bearing Restrictions: No Other Position/Activity Restrictions: WBAT    Mobility  Bed Mobility Overal bed mobility: Needs Assistance Bed Mobility: Supine to Sit     Supine to sit: Min assist Sit to supine: Mod assist   General bed mobility comments: cues for position on the bed, mod assist for the R leg onto the bed.  slow moving.    Transfers Overall transfer level: Needs assistance Equipment used: Rolling walker (2 wheeled) Transfers: Sit to/from Stand Sit to Stand: Min assist         General transfer comment: multimodal cues for hand and R leg position for sit to stand to sit.  requires increased time due to max c/o feeling "woozy".   Unsteady.   Ambulation/Gait Ambulation/Gait assistance: Min assist Ambulation Distance (Feet): 55 Feet Assistive device: Rolling walker (2 wheeled) Gait Pattern/deviations: Step-to pattern;Decreased stance time - right Gait velocity: decreased   General Gait Details: decreased amb distance due to increased c/o pain and fatigue.  Pt reports feeling "woozy".  Unsteady with turns and backward gait to bed.     Stairs            Wheelchair Mobility    Modified Rankin (Stroke Patients Only)        Balance                                    Cognition Arousal/Alertness: Awake/alert Behavior During Therapy: WFL for tasks assessed/performed Overall Cognitive Status: Within Functional Limits for tasks assessed                      Exercises   Total Hip Replacement TE's 10 reps ankle pumps 10 reps knee presses 10 reps heel slides AAROM 10 reps SAQ's 10 reps ABD AAROM Followed by ICE     General Comments        Pertinent Vitals/Pain Pain Assessment: 0-10 Pain Score: 5  Pain Location: R hip with amb 5/10 then increased to 8/10 with TE's Pain Descriptors / Indicators: Sore;Grimacing Pain Intervention(s): Monitored during session;Repositioned;Ice applied    Home Living Family/patient expects to be discharged to:: Private residence     Type of Home: House Home Access: Stairs to enter Entrance Stairs-Rails: Right;Left Home Layout: One level Home Equipment: Environmental consultant - 2 wheels;Cane - single point Additional Comments: may be able to borrow 3:1 commode    Prior Function Level of Independence: Independent with assistive device(s)      Comments: has had to use the cane , especially on incline to get to her steps and door.   PT Goals (current goals can now be found in the  care plan section) Acute Rehab PT Goals Patient Stated Goal: get back to being independent PT Goal Formulation: With patient Time For Goal Achievement: 11/19/15 Potential to Achieve Goals: Good Progress towards PT goals: Progressing toward goals    Frequency  7X/week    PT Plan Current plan remains appropriate    Co-evaluation             End of Session Equipment Utilized During Treatment: Gait belt Activity Tolerance: Patient limited by fatigue;Patient limited by pain Patient left: in bed;with call bell/phone within reach;with family/visitor present     Time: 1610-9604 PT Time Calculation (min) (ACUTE ONLY): 25 min  Charges:  $Gait Training: 8-22  mins $Therapeutic Exercise: 8-22 mins                    G Codes:      Felecia Shelling  PTA [facility]  Acute  Rehab Pager      [phone]

## 2015-11-12 NOTE — Discharge Instructions (Addendum)
° °Dr. Frank Aluisio °Total Joint Specialist °[facility] °[address] °[place], [place] [address] °[phone] ° °ANTERIOR APPROACH TOTAL HIP REPLACEMENT POSTOPERATIVE DIRECTIONS ° ° °Hip Rehabilitation, Guidelines Following Surgery  °The results of a hip operation are greatly improved after range of motion and muscle strengthening exercises. Follow all safety measures which are given to protect your hip. If any of these exercises cause increased pain or swelling in your joint, decrease the amount until you are comfortable again. Then slowly increase the exercises. Call your caregiver if you have problems or questions.  ° °HOME CARE INSTRUCTIONS  °Remove items at home which could result in a fall. This includes throw rugs or furniture in walking pathways.  °· ICE to the affected hip every three hours for 30 minutes at a time and then as needed for pain and swelling.  Continue to use ice on the hip for pain and swelling from surgery. You may notice swelling that will progress down to the foot and ankle.  This is normal after surgery.  Elevate the leg when you are not up walking on it.   °· Continue to use the breathing machine which will help keep your temperature down.  It is common for your temperature to cycle up and down following surgery, especially at night when you are not up moving around and exerting yourself.  The breathing machine keeps your lungs expanded and your temperature down. ° ° °DIET °You may resume your previous home diet once your are discharged from the hospital. ° °DRESSING / WOUND CARE / SHOWERING °You may shower 3 days after surgery, but keep the wounds dry during showering.  You may use an occlusive plastic wrap (Press'n Seal for example), NO SOAKING/SUBMERGING IN THE BATHTUB.  If the bandage gets wet, change with a clean dry gauze.  If the incision gets wet, pat the wound dry with a clean towel. °You may start showering once you are discharged home but do not  submerge the incision under water. Just pat the incision dry and apply a dry gauze dressing on daily. °Change the surgical dressing daily and reapply a dry dressing each time. ° °ACTIVITY °Walk with your walker as instructed. °Use walker as long as suggested by your caregivers. °Avoid periods of inactivity such as sitting longer than an hour when not asleep. This helps prevent blood clots.  °You may resume a sexual relationship in one month or when given the OK by your doctor.  °You may return to work once you are cleared by your doctor.  °Do not drive a car for 6 weeks or until released by you surgeon.  °Do not drive while taking narcotics. ° °WEIGHT BEARING °Weight bearing as tolerated with assist device (walker, cane, etc) as directed, use it as long as suggested by your surgeon or therapist, typically at least 4-6 weeks. ° °POSTOPERATIVE CONSTIPATION PROTOCOL °Constipation - defined medically as fewer than three stools per week and severe constipation as less than one stool per week. ° °One of the most common issues patients have following surgery is constipation.  Even if you have a regular bowel pattern at home, your normal regimen is likely to be disrupted due to multiple reasons following surgery.  Combination of anesthesia, postoperative narcotics, change in appetite and fluid intake all can affect your bowels.  In order to avoid complications following surgery, here are some recommendations in order to help you during your recovery period. ° °Colace (docusate) - Pick up an over-the-counter   form of Colace or another stool softener and take twice a day as long as you are requiring postoperative pain medications.  Take with a full glass of water daily.  If you experience loose stools or diarrhea, hold the colace until you stool forms back up.  If your symptoms do not get better within 1 week or if they get worse, check with your doctor. ° °Dulcolax (bisacodyl) - Pick up over-the-counter and take as directed  by the product packaging as needed to assist with the movement of your bowels.  Take with a full glass of water.  Use this product as needed if not relieved by Colace only.  ° °MiraLax (polyethylene glycol) - Pick up over-the-counter to have on hand.  MiraLax is a solution that will increase the amount of water in your bowels to assist with bowel movements.  Take as directed and can mix with a glass of water, juice, soda, coffee, or tea.  Take if you go more than two days without a movement. °Do not use MiraLax more than once per day. Call your doctor if you are still constipated or irregular after using this medication for 7 days in a row. ° °If you continue to have problems with postoperative constipation, please contact the office for further assistance and recommendations.  If you experience "the worst abdominal pain ever" or develop nausea or vomiting, please contact the office immediatly for further recommendations for treatment. ° °ITCHING ° If you experience itching with your medications, try taking only a single pain pill, or even half a pain pill at a time.  You can also use Benadryl over the counter for itching or also to help with sleep.  ° °TED HOSE STOCKINGS °Wear the elastic stockings on both legs for three weeks following surgery during the day but you may remove then at night for sleeping. ° °MEDICATIONS °See your medication summary on the “After Visit Summary” that the nursing staff will review with you prior to discharge.  You may have some home medications which will be placed on hold until you complete the course of blood thinner medication.  It is important for you to complete the blood thinner medication as prescribed by your surgeon.  Continue your approved medications as instructed at time of discharge. ° °PRECAUTIONS °If you experience chest pain or shortness of breath - call 911 immediately for transfer to the hospital emergency department.  °If you develop a fever greater that 101 F,  purulent drainage from wound, increased redness or drainage from wound, foul odor from the wound/dressing, or calf pain - CONTACT YOUR SURGEON.   °                                                °FOLLOW-UP APPOINTMENTS °Make sure you keep all of your appointments after your operation with your surgeon and caregivers. You should call the office at the above phone number and make an appointment for approximately two weeks after the date of your surgery or on the date instructed by your surgeon outlined in the "After Visit Summary". ° °RANGE OF MOTION AND STRENGTHENING EXERCISES  °These exercises are designed to help you keep full movement of your hip joint. Follow your caregiver's or physical therapist's instructions. Perform all exercises about fifteen times, three times per day or as directed. Exercise both hips, even if you   have had only one joint replacement. These exercises can be done on a training (exercise) mat, on the floor, on a table or on a bed. Use whatever works the best and is most comfortable for you. Use music or television while you are exercising so that the exercises are a pleasant break in your day. This will make your life better with the exercises acting as a break in routine you can look forward to.  °Lying on your back, slowly slide your foot toward your buttocks, raising your knee up off the floor. Then slowly slide your foot back down until your leg is straight again.  °Lying on your back spread your legs as far apart as you can without causing discomfort.  °Lying on your side, raise your upper leg and foot straight up from the floor as far as is comfortable. Slowly lower the leg and repeat.  °Lying on your back, tighten up the muscle in the front of your thigh (quadriceps muscles). You can do this by keeping your leg straight and trying to raise your heel off the floor. This helps strengthen the largest muscle supporting your knee.  °Lying on your back, tighten up the muscles of your  buttocks both with the legs straight and with the knee bent at a comfortable angle while keeping your heel on the floor.  ° °IF YOU ARE TRANSFERRED TO A SKILLED REHAB FACILITY °If the patient is transferred to a skilled rehab facility following release from the hospital, a list of the current medications will be sent to the facility for the patient to continue.  When discharged from the skilled rehab facility, please have the facility set up the patient's Home Health Physical Therapy prior to being released. Also, the skilled facility will be responsible for providing the patient with their medications at time of release from the facility to include their pain medication, the muscle relaxants, and their blood thinner medication. If the patient is still at the rehab facility at time of the two week follow up appointment, the skilled rehab facility will also need to assist the patient in arranging follow up appointment in our office and any transportation needs. ° °MAKE SURE YOU:  °Understand these instructions.  °Get help right away if you are not doing well or get worse.  ° ° °Pick up stool softner and laxative for home use following surgery while on pain medications. °Do not submerge incision under water. °Please use good hand washing techniques while changing dressing each day. °May shower starting three days after surgery. °Please use a clean towel to pat the incision dry following showers. °Continue to use ice for pain and swelling after surgery. °Do not use any lotions or creams on the incision until instructed by your surgeon. ° °Take Xarelto for two and a half more weeks, then discontinue Xarelto. °Once the patient has completed the blood thinner regimen, then take a Baby 81 mg Aspirin daily for three more weeks. ° ° °Information on my medicine - XARELTO® (Rivaroxaban) ° °This medication education was reviewed with me or my healthcare representative as part of my discharge preparation.  The pharmacist that  spoke with me during my hospital stay was:  WOFFORD, DREW A, RPH ° °Why was Xarelto® prescribed for you? °Xarelto® was prescribed for you to reduce the risk of blood clots forming after orthopedic surgery. The medical term for these abnormal blood clots is venous thromboembolism (VTE). ° °What do you need to know about xarelto® ? °Take your Xarelto®   ONCE DAILY at the same time every day. °You may take it either with or without food. ° °If you have difficulty swallowing the tablet whole, you may crush it and mix in applesauce just prior to taking your dose. ° °Take Xarelto® exactly as prescribed by your doctor and DO NOT stop taking Xarelto® without talking to the doctor who prescribed the medication.  Stopping without other VTE prevention medication to take the place of Xarelto® may increase your risk of developing a clot. ° °After discharge, you should have regular check-up appointments with your healthcare provider that is prescribing your Xarelto®.   ° °What do you do if you miss a dose? °If you miss a dose, take it as soon as you remember on the same day then continue your regularly scheduled once daily regimen the next day. Do not take two doses of Xarelto® on the same day.  ° °Important Safety Information °A possible side effect of Xarelto® is bleeding. You should call your healthcare provider right away if you experience any of the following: °? Bleeding from an injury or your nose that does not stop. °? Unusual colored urine (red or dark brown) or unusual colored stools (red or black). °? Unusual bruising for unknown reasons. °? A serious fall or if you hit your head (even if there is no bleeding). ° °Some medicines may interact with Xarelto® and might increase your risk of bleeding while on Xarelto®. To help avoid this, consult your healthcare provider or pharmacist prior to using any new prescription or non-prescription medications, including herbals, vitamins, non-steroidal anti-inflammatory drugs  (NSAIDs) and supplements. ° °This website has more information on Xarelto®: www.xarelto.com. ° ° ° °

## 2015-11-12 NOTE — Care Management Note (Signed)
Case Management Note  Patient Details  Name: Amanda Washington MRN: [id number] Date of Birth: 1956-02-24  Subjective/Objective:                  RIGHT TOTAL HIP ARTHROPLASTY ANTERIOR APPROACH (Right) Action/Plan: Discharge planning Expected Discharge Date:  11/11/15               Expected Discharge Plan:  Skilled Nursing Facility  In-House Referral:     Discharge planning Services  CM Consult  Post Acute Care Choice:    Choice offered to:     DME Arranged:    DME Agency:     HH Arranged:    HH Agency:     Status of Service:  Completed, signed off  Medicare Important Message Given:    Date Medicare IM Given:    Medicare IM give by:    Date Additional Medicare IM Given:    Additional Medicare Important Message give by:     If discussed at Long Length of Stay Meetings, dates discussed:    Additional Comments: Cm notes pt to go to SNF; CSW arranging.  No other CM needs were communicated. Yves Dill, RN 11/12/2015, 2:21 PM

## 2015-11-13 LAB — CBC
HEMATOCRIT: 29.5 % — AB (ref 36.0–46.0)
Hemoglobin: 9.7 g/dL — ABNORMAL LOW (ref 12.0–15.0)
MCH: 30 pg (ref 26.0–34.0)
MCHC: 32.9 g/dL (ref 30.0–36.0)
MCV: 91.3 fL (ref 78.0–100.0)
PLATELETS: 252 10*3/uL (ref 150–400)
RBC: 3.23 MIL/uL — AB (ref 3.87–5.11)
RDW: 13.4 % (ref 11.5–15.5)
WBC: 10 10*3/uL (ref 4.0–10.5)

## 2015-11-13 LAB — BASIC METABOLIC PANEL
ANION GAP: 9 (ref 5–15)
BUN: 14 mg/dL (ref 6–20)
CO2: 29 mmol/L (ref 22–32)
Calcium: 9.2 mg/dL (ref 8.9–10.3)
Chloride: 105 mmol/L (ref 101–111)
Creatinine, Ser: 0.74 mg/dL (ref 0.44–1.00)
GFR calc Af Amer: >60 mL/min (ref >60)
GLUCOSE: 113 mg/dL — AB (ref 65–99)
POTASSIUM: 4 mmol/L (ref 3.5–5.1)
Sodium: 143 mmol/L (ref 135–145)

## 2015-11-13 MED ORDER — DIPHENHYDRAMINE HCL 12.5 MG/5ML PO ELIX
12.5000 mg | ORAL_SOLUTION | ORAL | Status: DC | PRN
Start: 2015-11-13 — End: 2016-10-31

## 2015-11-13 MED ORDER — POLYETHYLENE GLYCOL 3350 17 G PO PACK: 17.0000 g | PACK | Freq: Every day | ORAL | Status: AC | PRN

## 2015-11-13 MED ORDER — METHOCARBAMOL 500 MG PO TABS: 500.0000 mg | ORAL_TABLET | Freq: Four times a day (QID) | ORAL | Status: DC | PRN

## 2015-11-13 MED ORDER — BISACODYL 10 MG RE SUPP: 10.0000 mg | Freq: Every day | RECTAL | Status: DC | PRN

## 2015-11-13 MED ORDER — DOCUSATE SODIUM 100 MG PO CAPS: 100.0000 mg | ORAL_CAPSULE | Freq: Two times a day (BID) | ORAL | Status: DC

## 2015-11-13 MED ORDER — ONDANSETRON HCL 4 MG PO TABS: 4.0000 mg | ORAL_TABLET | Freq: Four times a day (QID) | ORAL | Status: DC | PRN

## 2015-11-13 MED ORDER — OXYCODONE HCL 5 MG PO TABS: 5.0000 mg | ORAL_TABLET | ORAL | Status: DC | PRN

## 2015-11-13 MED ORDER — TRAMADOL HCL 50 MG PO TABS: 50.0000 mg | ORAL_TABLET | Freq: Four times a day (QID) | ORAL | Status: DC | PRN

## 2015-11-13 MED ORDER — RIVAROXABAN 10 MG PO TABS: 10.0000 mg | ORAL_TABLET | Freq: Every day | ORAL | Status: DC

## 2015-11-13 MED ORDER — METOCLOPRAMIDE HCL 5 MG PO TABS: 5.0000 mg | ORAL_TABLET | Freq: Three times a day (TID) | ORAL | Status: DC | PRN

## 2015-11-13 MED ORDER — FLEET ENEMA 7-19 GM/118ML RE ENEM: 1.0000 | ENEMA | Freq: Once | RECTAL | Status: DC | PRN

## 2015-11-13 NOTE — Progress Notes (Signed)
CSW assisting with d/c planning. [employer] has approved SNF placement for pt. [facility] able to accept pt today if stable for d/c.  Cori Razor LCSW [phone]

## 2015-11-13 NOTE — Progress Notes (Signed)
Physical Therapy Treatment Patient Details Name: Amanda Washington MRN: [id number] DOB: [birth year] Today's Date: 11/13/2015    History of Present Illness s/p R DA THA    PT Comments    POD # 2 Pain meds changed to Tramadol.  BP sitting prior to amb 113/66.  Pt tolerated increased distance with no c/o dizziness and tolerated increased distance.  BP after amb was 131/84.    Follow Up Recommendations  SNF     Equipment Recommendations  None recommended by PT    Recommendations for Other Services       Precautions / Restrictions Precautions Precautions: Fall Precaution Comments: monitor BP Restrictions Weight Bearing Restrictions: No Other Position/Activity Restrictions: WBAT    Mobility  Bed Mobility               General bed mobility comments: Pt OOB in recliner  Transfers Overall transfer level: Needs assistance Equipment used: Rolling walker (2 wheeled) Transfers: Sit to/from Stand Sit to Stand: Min guard         General transfer comment: one VC on hand placement with sit to stand   Ambulation/Gait Ambulation/Gait assistance: Supervision;Min guard Ambulation Distance (Feet): 52 Feet Assistive device: Rolling walker (2 wheeled) Gait Pattern/deviations: Step-to pattern Gait velocity: decreased   General Gait Details: slow but steady gait.  only slight c/o feeling "different" but no dizziness.     Stairs            Wheelchair Mobility    Modified Rankin (Stroke Patients Only)       Balance                                    Cognition Arousal/Alertness: Awake/alert Behavior During Therapy: WFL for tasks assessed/performed Overall Cognitive Status: Within Functional Limits for tasks assessed                      Exercises      General Comments        Pertinent Vitals/Pain Pain Assessment: 0-10 Pain Score: 3  Pain Location: Rhip Pain Descriptors / Indicators: Sore;Tender Pain Intervention(s): Monitored  during session;Repositioned;Ice applied    Home Living                      Prior Function            PT Goals (current goals can now be found in the care plan section) Progress towards PT goals: Progressing toward goals    Frequency  7X/week    PT Plan Current plan remains appropriate    Co-evaluation             End of Session Equipment Utilized During Treatment: Gait belt Activity Tolerance: Patient tolerated treatment well Patient left: in chair;with call bell/phone within reach     Time: 1445-1500 PT Time Calculation (min) (ACUTE ONLY): 15 min  Charges:  $Gait Training: 8-22 mins                    G Codes:      Felecia Shelling  PTA [facility]  Acute  Rehab Pager      [phone]

## 2015-11-13 NOTE — Progress Notes (Signed)
   Subjective: 2 Days Post-Op Procedure(s) (LRB): RIGHT TOTAL HIP ARTHROPLASTY ANTERIOR APPROACH (Right) Patient reports pain as mild.   Plan is to go Skilled nursing facility after hospital stay.  Objective: Vital signs in last 24 hours: Temp:  [97.8 F (36.6 C)-98.7 F (37.1 C)] 98.3 F (36.8 C) (01/27 0556) Pulse Rate:  [78-90] 90 (01/27 0556) Resp:  [15-16] 16 (01/27 0556) BP: (98-128)/(48-67) 98/48 mmHg (01/27 0911) SpO2:  [95 %-100 %] 95 % (01/27 0556)  Intake/Output from previous day:  Intake/Output Summary (Last 24 hours) at 11/13/15 1430 Last data filed at 11/13/15 1400  Gross per 24 hour  Intake    720 ml  Output    700 ml  Net     20 ml    Intake/Output this shift: Total I/O In: 360 [P.O.:360] Out: -   Labs:  Recent Labs  11/12/15 0420 11/13/15 0448  HGB 9.4* 9.7*    Recent Labs  11/12/15 0420 11/13/15 0448  WBC 9.2 10.0  RBC 3.18* 3.23*  HCT 28.9* 29.5*  PLT 241 252    Recent Labs  11/12/15 0420 11/13/15 0448  NA 141 143  K 4.3 4.0  CL 107 105  CO2 26 29  BUN 12 14  CREATININE 0.77 0.74  GLUCOSE 139* 113*  CALCIUM 8.8* 9.2   No results for input(s): LABPT, INR in the last 72 hours.  EXAM General - Patient is Alert, Appropriate and Oriented Extremity - Neurologically intact Neurovascular intact No cellulitis present Compartment soft Dressing/Incision - clean, dry, no drainage Motor Function - intact, moving foot and toes well on exam.   Past Medical History  Diagnosis Date  . IBS (irritable bowel syndrome)   . Fracture of foot 08/2013    left  . Hx of seasonal allergies   . Migraine headache     better since off OCP 07/2010. no longer a problem after menopause  . Arthritis     osteoarthritis- hip ,bilateral  . Bleeding nose     freq occ. -during childhood and related to sinus infection or dryness-less frequent now.  . Complication of anesthesia     Nausea and vomiting w Ether  . PONV (postoperative nausea and  vomiting)     Assessment/Plan: 2 Days Post-Op Procedure(s) (LRB): RIGHT TOTAL HIP ARTHROPLASTY ANTERIOR APPROACH (Right) Principal Problem:   OA (osteoarthritis) of hip   Discharge to SNF  DVT Prophylaxis - Xarelto Weight Bearing As Tolerated right Leg  Amanda Washington V 11/13/2015, 2:30 PM

## 2015-11-13 NOTE — Progress Notes (Signed)
CSW assisting with d/c planning. [facility] continues to work with [employer] for authorization. SNF is expecting a decision today from insurance.   Cori Razor LCSW [phone]

## 2015-11-13 NOTE — Discharge Summary (Signed)
Physician Discharge Summary   Patient ID: Amanda Washington MRN: [id number] DOB/AGE: 59-Oct-1957 59 y.o.  Admit date: 11/11/2015 Discharge date: 11-13-2015  Primary Diagnosis:  Osteoarthritis of the Right hip.   Admission Diagnoses:  Past Medical History  Diagnosis Date  . IBS (irritable bowel syndrome)   . Fracture of foot 08/2013    left  . Hx of seasonal allergies   . Migraine headache     better since off OCP 07/2010. no longer a problem after menopause  . Arthritis     osteoarthritis- hip ,bilateral  . Bleeding nose     freq occ. -during childhood and related to sinus infection or dryness-less frequent now.  . Complication of anesthesia     Nausea and vomiting w Ether  . PONV (postoperative nausea and vomiting)    Discharge Diagnoses:   Principal Problem:   OA (osteoarthritis) of hip  Estimated body mass index is 25.25 kg/(m^2) as calculated from the following:   Height as of this encounter: '5\' 10"'  (1.778 m).   Weight as of this encounter: 79.833 kg (176 lb).  Procedure(s) (LRB): RIGHT TOTAL HIP ARTHROPLASTY ANTERIOR APPROACH (Right)   Consults: None  HPI: Amanda Washington is a 60 y.o. 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   Laboratory Data: Admission on 11/11/2015  Component Date Value Ref Range Status  . WBC 11/12/2015 9.2  4.0 - 10.5 K/uL Final  . RBC 11/12/2015 3.18* 3.87 - 5.11 MIL/uL Final  . Hemoglobin 11/12/2015 9.4* 12.0 - 15.0 g/dL Final  . HCT 11/12/2015 28.9* 36.0 - 46.0 % Final  . MCV 11/12/2015 90.9  78.0 - 100.0 fL Final  . MCH 11/12/2015 29.6  26.0 - 34.0 pg Final  . MCHC 11/12/2015 32.5  30.0 - 36.0 g/dL Final  . RDW 11/12/2015 13.1  11.5 - 15.5 % Final  . Platelets 11/12/2015 241  150 - 400 K/uL Final  . Sodium 11/12/2015 141  135 - 145 mmol/L Final    . Potassium 11/12/2015 4.3  3.5 - 5.1 mmol/L Final  . Chloride 11/12/2015 107  101 - 111 mmol/L Final  . CO2 11/12/2015 26  22 - 32 mmol/L Final  . Glucose, Bld 11/12/2015 139* 65 - 99 mg/dL Final  . BUN 11/12/2015 12  6 - 20 mg/dL Final  . Creatinine, Ser 11/12/2015 0.77  0.44 - 1.00 mg/dL Final  . Calcium 11/12/2015 8.8* 8.9 - 10.3 mg/dL Final  . GFR calc non Af Amer 11/12/2015 >60  >60 mL/min Final  . GFR calc Af Amer 11/12/2015 >60  >60 mL/min Final   Comment: (NOTE) The eGFR has been calculated using the CKD EPI equation. This calculation has not been validated in all clinical situations. eGFR's persistently <60 mL/min signify possible Chronic Kidney Disease.   . Anion gap 11/12/2015 8  5 - 15 Final  . WBC 11/13/2015 10.0  4.0 - 10.5 K/uL Final  . RBC 11/13/2015 3.23* 3.87 - 5.11 MIL/uL Final  . Hemoglobin 11/13/2015 9.7* 12.0 - 15.0 g/dL Final  . HCT 11/13/2015 29.5* 36.0 - 46.0 % Final  . MCV 11/13/2015 91.3  78.0 - 100.0 fL Final  . MCH 11/13/2015 30.0  26.0 - 34.0 pg Final  . MCHC 11/13/2015 32.9  30.0 - 36.0 g/dL Final  . RDW 11/13/2015 13.4  11.5 - 15.5 % Final  .  Platelets 11/13/2015 252  150 - 400 K/uL Final  . Sodium 11/13/2015 143  135 - 145 mmol/L Final  . Potassium 11/13/2015 4.0  3.5 - 5.1 mmol/L Final  . Chloride 11/13/2015 105  101 - 111 mmol/L Final  . CO2 11/13/2015 29  22 - 32 mmol/L Final  . Glucose, Bld 11/13/2015 113* 65 - 99 mg/dL Final  . BUN 11/13/2015 14  6 - 20 mg/dL Final  . Creatinine, Ser 11/13/2015 0.74  0.44 - 1.00 mg/dL Final  . Calcium 11/13/2015 9.2  8.9 - 10.3 mg/dL Final  . GFR calc non Af Amer 11/13/2015 >60  >60 mL/min Final  . GFR calc Af Amer 11/13/2015 >60  >60 mL/min Final   Comment: (NOTE) The eGFR has been calculated using the CKD EPI equation. This calculation has not been validated in all clinical situations. eGFR's persistently <60 mL/min signify possible Chronic Kidney Disease.   Georgiann Hahn gap 11/13/2015 9  5 - 15 Final   Hospital Outpatient Visit on 11/05/2015  Component Date Value Ref Range Status  . MRSA, PCR 11/05/2015 NEGATIVE  NEGATIVE Final  . Staphylococcus aureus 11/05/2015 POSITIVE* NEGATIVE Final   Comment:        The Xpert SA Assay (FDA approved for NASAL specimens in patients over 39 years of age), is one component of a comprehensive surveillance program.  Test performance has been validated by [facility] for patients greater than or equal to 54 year old. It is not intended to diagnose infection nor to guide or monitor treatment.   Marland Kitchen aPTT 11/05/2015 35  24 - 37 seconds Final  . WBC 11/05/2015 6.4  4.0 - 10.5 K/uL Final  . RBC 11/05/2015 4.07  3.87 - 5.11 MIL/uL Final  . Hemoglobin 11/05/2015 11.9* 12.0 - 15.0 g/dL Final  . HCT 11/05/2015 36.2  36.0 - 46.0 % Final  . MCV 11/05/2015 88.9  78.0 - 100.0 fL Final  . MCH 11/05/2015 29.2  26.0 - 34.0 pg Final  . MCHC 11/05/2015 32.9  30.0 - 36.0 g/dL Final  . RDW 11/05/2015 12.9  11.5 - 15.5 % Final  . Platelets 11/05/2015 260  150 - 400 K/uL Final  . Sodium 11/05/2015 138  135 - 145 mmol/L Final  . Potassium 11/05/2015 4.0  3.5 - 5.1 mmol/L Final  . Chloride 11/05/2015 103  101 - 111 mmol/L Final  . CO2 11/05/2015 27  22 - 32 mmol/L Final  . Glucose, Bld 11/05/2015 100* 65 - 99 mg/dL Final  . BUN 11/05/2015 18  6 - 20 mg/dL Final  . Creatinine, Ser 11/05/2015 0.71  0.44 - 1.00 mg/dL Final  . Calcium 11/05/2015 9.3  8.9 - 10.3 mg/dL Final  . Total Protein 11/05/2015 7.2  6.5 - 8.1 g/dL Final  . Albumin 11/05/2015 4.3  3.5 - 5.0 g/dL Final  . AST 11/05/2015 23  15 - 41 U/L Final  . ALT 11/05/2015 18  14 - 54 U/L Final  . Alkaline Phosphatase 11/05/2015 76  38 - 126 U/L Final  . Total Bilirubin 11/05/2015 0.7  0.3 - 1.2 mg/dL Final  . GFR calc non Af Amer 11/05/2015 >60  >60 mL/min Final  . GFR calc Af Amer 11/05/2015 >60  >60 mL/min Final   Comment: (NOTE) The eGFR has been calculated using the CKD EPI equation. This calculation  has not been validated in all clinical situations. eGFR's persistently <60 mL/min signify possible Chronic Kidney Disease.   . Anion gap 11/05/2015 8  5 - 15 Final  . Prothrombin Time 11/05/2015 14.0  11.6 - 15.2 seconds Final  . INR 11/05/2015 1.06  0.00 - 1.49 Final  . ABO/RH(D) 11/05/2015 A POS   Final  . Antibody Screen 11/05/2015 NEG   Final  . Sample Expiration 11/05/2015 11/14/2015   Final  . Extend sample reason 11/05/2015 NO TRANSFUSIONS OR PREGNANCY IN THE PAST 3 MONTHS   Final  . Color, Urine 11/05/2015 YELLOW  YELLOW Final  . APPearance 11/05/2015 CLEAR  CLEAR Final  . Specific Gravity, Urine 11/05/2015 1.006  1.005 - 1.030 Final  . pH 11/05/2015 7.0  5.0 - 8.0 Final  . Glucose, UA 11/05/2015 NEGATIVE  NEGATIVE mg/dL Final  . Hgb urine dipstick 11/05/2015 NEGATIVE  NEGATIVE Final  . Bilirubin Urine 11/05/2015 NEGATIVE  NEGATIVE Final  . Ketones, ur 11/05/2015 NEGATIVE  NEGATIVE mg/dL Final  . Protein, ur 11/05/2015 NEGATIVE  NEGATIVE mg/dL Final  . Nitrite 11/05/2015 NEGATIVE  NEGATIVE Final  . Leukocytes, UA 11/05/2015 NEGATIVE  NEGATIVE Final   MICROSCOPIC NOT DONE ON URINES WITH NEGATIVE PROTEIN, BLOOD, LEUKOCYTES, NITRITE, OR GLUCOSE <1000 mg/dL.  . ABO/RH(D) 11/05/2015 A POS   Final  Office Visit on 10/28/2015  Component Date Value Ref Range Status  . Hemoglobin, fingerstick 10/28/2015 11.9* 12.0 - 16.0 g/dL Final  . Color, UA 10/28/2015 yellow   Final  . Clarity, UA 10/28/2015 clear   Final  . Glucose, UA 10/28/2015 neg   Final  . Bilirubin, UA 10/28/2015 neg   Final  . Ketones, UA 10/28/2015 neg   Final  . Blood, UA 10/28/2015 neg   Final  . pH, UA 10/28/2015 7.0   Final  . Protein, UA 10/28/2015 neg   Final  . Urobilinogen, UA 10/28/2015 negative   Final  . Nitrite, UA 10/28/2015 neg   Final  . Leukocytes, UA 10/28/2015 Negative  Negative Final  . Cholesterol 10/28/2015 189  125 - 200 mg/dL Final  . Triglycerides 10/28/2015 53  <150 mg/dL Final  . HDL  10/28/2015 71  >=46 mg/dL Final  . Total CHOL/HDL Ratio 10/28/2015 2.7  <=5.0 Ratio Final  . VLDL 10/28/2015 11  <30 mg/dL Final  . LDL Cholesterol 10/28/2015 107  <130 mg/dL Final   Comment:   Total Cholesterol/HDL Ratio:CHD Risk                        Coronary Heart Disease Risk Table                                        Men       Women          1/2 Average Risk              3.4        3.3              Average Risk              5.0        4.4           2X Average Risk              9.6        7.1           3X Average Risk             23.4  11.0 Use the calculated Patient Ratio above and the CHD Risk table  to determine the patient's CHD Risk.   Marland Kitchen TSH 10/28/2015 2.157  0.350 - 4.500 uIU/mL Final  . Vit D, 25-Hydroxy 10/28/2015 58  30 - 100 ng/mL Final   Comment: Vitamin D Status           25-OH Vitamin D        Deficiency                <20 ng/mL        Insufficiency         20 - 29 ng/mL        Optimal             > or = 30 ng/mL   For 25-OH Vitamin D testing on patients on D2-supplementation and patients for whom quantitation of D2 and D3 fractions is required, the QuestAssureD 25-OH VIT D, (D2,D3), LC/MS/MS is recommended: order code [id number] (patients > 2 yrs).   Marland Kitchen HIV 1&2 Ab, 4th Generation 10/28/2015 NONREACTIVE  NONREACTIVE Final   Comment:   HIV-1 antigen and HIV-1/HIV-2 antibodies were not detected.  There is no laboratory evidence of HIV infection.   HIV-1/2 Antibody Diff        Not indicated. HIV-1 RNA, Qual TMA          Not indicated.     PLEASE NOTE: This information has been disclosed to you from records whose confidentiality may be protected by state law. If your state requires such protection, then the state law prohibits you from making any further disclosure of the information without the specific written consent of the person to whom it pertains, or as otherwise permitted by law. A general authorization for the release of medical or other information  is NOT sufficient for this purpose.   The performance of this assay has not been clinically validated in patients less than 35 years old.   For additional information please refer to http://education.questdiagnostics.com/faq/FAQ106.  (This link is being provided for informational/educational purposes only.)     . HCV Ab 10/28/2015 NEGATIVE  NEGATIVE Final  . Hgb A1c MFr Bld 10/28/2015 5.9* <5.7 % Final   Comment:                                                                        According to the ADA Clinical Practice Recommendations for 2011, when HbA1c is used as a screening test:     >=6.5%   Diagnostic of Diabetes Mellitus            (if abnormal result is confirmed)   5.7-6.4%   Increased risk of developing Diabetes Mellitus   References:Diagnosis and Classification of Diabetes Mellitus,Diabetes KYHC,6237,62(GBTDV 1):S62-S69 and Standards of Medical Care in         Diabetes - 2011,Diabetes VOHY,0737,10 (Suppl 1):S11-S61.     . Mean Plasma Glucose 10/28/2015 123* <117 mg/dL Final  . HM Colonoscopy 06/18/2007 normal   Final  . COMMENTS: 10/28/2015 [facility]   Final-Edited   Comment: [place], [place] [address] [place] [address] GYN CYTOLOGY REPORT  PATIENT NAME:Steinhardt, Arpi PATHOLOGY#:[id number]: F DOB: 1956-03-24 (Age: 12) MEDICAL  EPPIRJ#:[id number] DOCTOR:Patricia Raquel Sarna, FNP DATE OBTAINED:1/11/2017CLIENT:[facility] DATE RECEIVED:1/12/2017OTHER PHYS: DATE SIGNED:10/29/2015 PAP Thinlayer with HPV Final Cytologic Interpretation:       Cervical, ThinLayer with Automated Imaging and Dual Review, CPT 88175      Negative for Intraepithelial Lesions or Malignancy.       ADEQUACY OF SPECIMEN:           Satisfactory for evaluation. The presence or absence of endocervical cells/transformation zone component cannot be determined due to atrophy.             NOTE: This Pap test has been evaluated  with computer assisted technology.       Electronically signed by: [place], [place](ASCP), [address] [place] [address] (Med. Dir.: Darren Wirthwein) ndh/1/12/2017The Pap test is a screening mechanism with                           excellent but not perfect ability to prevent cervical carcinoma.  It has a low, but  significant, diagnostic error rate. The pap test is suboptimal  for detection of glandular lesions.  It should be noted that a negative result does not definitively rule out the presence of disease.Ref: DeMay, RM, The Art and Science of Cytopathology,  [employer], [phone]. HPV Results   High Risk HPV -    Not Detected  A result of "Detected" signifies the presence of one or more high risk types of HPV.  The APTIMA HPV Assay is an in vitro nucleic acid amplification test for the qualitative detection of E6/E7 viral messenger RNA (mRNA) from 14 high-risk types of  human papillomavirus (HPV) in cervical specimens. The high-risk HPV types detected by the assay include: 16, 18, 31, 33, 35, 39, 45, 51, 52, 56, 58, 59, 66, and 68. APTIMA HPV method will be performed on the [employer].  The APTIMA HPV Assay is designed to enhance existing methods for the detection of cervical dise                          ase and should be used in conjunction with clinical information derived from other diagnostic and screening tests, physical examinations, and full medical  history in accordance with appropriate patient management procedures. The APTIMA HPV Assay on ThinPrep(tm) PreservCyt(tm) specimens is FDA approved on the [employer].The APTIMA HPV Assay on SurePath(tm) specimens was developed and its performance characteristics determined by [facility].   It has not been cleared or approved by the U.S. Food and Drug Administration.  The FDA has determined that such clearance or approval is not necessary.  This test is used for clinical purposes.  This  laboratory is certified under the [employer] (CLIA) as qualified to perform high complexity clinical laboratory testing. Electronically signed by: Bethann Goo, [place](ASCP) [address], [place], [place] (Med. Dir.: Darren M[place]) Last Menstrual Period: 05/30/2010  Lynnae Sandhoff                          l processing performed at [employer], [address] [place], [place] [address], CLIA# [id number].      X-Rays:Dg Pelvis Portable  11/11/2015  CLINICAL DATA:  Status post right total hip replacement EXAM: DG C-ARM 1-60 MIN-NO REPORT; PORTABLE PELVIS 1-2 VIEWS COMPARISON:  None. Fluoroscopy: 0 minutes 9 seconds ; 0 fluoroscopic images submitted. FINDINGS:  There is a total hip prosthesis on the right with the prosthetic components appearing well-seated. There is moderate osteoarthritic change in the left hip joint. No acute fracture or dislocation. IMPRESSION: Right total hip prosthetic components appear well seated. No acute fracture dislocation. Moderate osteoarthritic change left hip joint. Electronically Signed   By: Lowella Grip III M.D.   On: 11/11/2015 14:30   Dg C-arm 1-60 Min-no Report  11/11/2015  CLINICAL DATA: right hip surgery C-ARM 1-60 MINUTES Fluoroscopy was utilized by the requesting physician.  No radiographic interpretation.    EKG:No orders found for this or any previous visit.   Hospital Course: Patient was admitted to [facility] and taken to the OR and underwent the above state procedure without complications.  Patient tolerated the procedure well and was later transferred to the recovery room and then to the orthopaedic floor for postoperative care.  They were given PO and IV analgesics for pain control following their surgery.  They were given 24 hours of postoperative antibiotics of  Anti-infectives    Start     Dose/Rate Route Frequency Ordered Stop   11/12/15 0030  vancomycin (VANCOCIN) IVPB 1000 mg/200 mL  premix     1,000 mg 200 mL/hr over 60 Minutes Intravenous Every 12 hours 11/11/15 1608 11/12/15 0118   11/11/15 0929  vancomycin (VANCOCIN) IVPB 1000 mg/200 mL premix     1,000 mg 200 mL/hr over 60 Minutes Intravenous On call to O.R. 11/11/15 3007 11/11/15 1219     and started on DVT prophylaxis in the form of Xarelto.   PT and OT were ordered for total hip protocol.  Social worker consulted to assist with placement.  The patient was allowed to be WBAT with therapy. Discharge planning was consulted to help with postop disposition and equipment needs.  Patient had a decent night on the evening of surgery.  They started to get up OOB with therapy on day one.  Hemovac drain was pulled without difficulty.  Continued to work with therapy into day two.  Dressing was changed on day two and the incision was healing well.  Patient was seen in rounds on POD 2 by Dr. Wynelle Link, received approval from insurance and was ready to go to go SNF.  Discharge to SNF Diet - Regular diet Follow up - in 2 weeks Activity - WBAT Disposition - Skilled nursing facility Condition Upon Discharge - Good D/C Meds - See DC Summary DVT Prophylaxis - Xarelto  Discharge Instructions    Call MD / Call 911    Complete by:  As directed   If you experience chest pain or shortness of breath, CALL 911 and be transported to the hospital emergency room.  If you develope a fever above 101 F, pus (white drainage) or increased drainage or redness at the wound, or calf pain, call your surgeon's office.     Change dressing    Complete by:  As directed   You may change your dressing dressing daily with sterile 4 x 4 inch gauze dressing and paper tape.  Do not submerge the incision under water.     Constipation Prevention    Complete by:  As directed   Drink plenty of fluids.  Prune juice may be helpful.  You may use a stool softener, such as Colace (over the counter) 100 mg twice a day.  Use MiraLax (over the counter) for constipation as  needed.     Diet general    Complete by:  As  directed      Discharge instructions    Complete by:  As directed   Pick up stool softner and laxative for home use following surgery while on pain medications. Do not submerge incision under water. Please use good hand washing techniques while changing dressing each day. May shower starting three days after surgery. Please use a clean towel to pat the incision dry following showers. Continue to use ice for pain and swelling after surgery. Do not use any lotions or creams on the incision until instructed by your surgeon.   Total Hip Protocol.  Take Xarelto for two and a half more weeks, then discontinue Xarelto. Once the patient has completed the blood thinner regimen, then take a Baby 81 mg Aspirin daily for three more weeks.  Postoperative Constipation Protocol  Constipation - defined medically as fewer than three stools per week and severe constipation as less than one stool per week.  One of the most common issues patients have following surgery is constipation.  Even if you have a regular bowel pattern at home, your normal regimen is likely to be disrupted due to multiple reasons following surgery.  Combination of anesthesia, postoperative narcotics, change in appetite and fluid intake all can affect your bowels.  In order to avoid complications following surgery, here are some recommendations in order to help you during your recovery period.  Colace (docusate) - Pick up an over-the-counter form of Colace or another stool softener and take twice a day as long as you are requiring postoperative pain medications.  Take with a full glass of water daily.  If you experience loose stools or diarrhea, hold the colace until you stool forms back up.  If your symptoms do not get better within 1 week or if they get worse, check with your doctor.  Dulcolax (bisacodyl) - Pick up over-the-counter and take as directed by the product packaging as needed to  assist with the movement of your bowels.  Take with a full glass of water.  Use this product as needed if not relieved by Colace only.   MiraLax (polyethylene glycol) - Pick up over-the-counter to have on hand.  MiraLax is a solution that will increase the amount of water in your bowels to assist with bowel movements.  Take as directed and can mix with a glass of water, juice, soda, coffee, or tea.  Take if you go more than two days without a movement. Do not use MiraLax more than once per day. Call your doctor if you are still constipated or irregular after using this medication for 7 days in a row.  If you continue to have problems with postoperative constipation, please contact the office for further assistance and recommendations.  If you experience "the worst abdominal pain ever" or develop nausea or vomiting, please contact the office immediatly for further recommendations for treatment.     Do not sit on low chairs, stoools or toilet seats, as it may be difficult to get up from low surfaces    Complete by:  As directed      Driving restrictions    Complete by:  As directed   No driving until released by the physician.     Increase activity slowly as tolerated    Complete by:  As directed      Lifting restrictions    Complete by:  As directed   No lifting until released by the physician.     Patient may shower    Complete by:  As directed   You may shower without a dressing once there is no drainage.  Do not wash over the wound.  If drainage remains, do not shower until drainage stops.     TED hose    Complete by:  As directed   Use stockings (TED hose) for 3 weeks on both leg(s).  You may remove them at night for sleeping.     Weight bearing as tolerated    Complete by:  As directed   Laterality:  right  Extremity:  Lower            Medication List    STOP taking these medications        ALEVE 220 MG Caps  Generic drug:  Naproxen Sodium     ergocalciferol 50000 units  capsule  Commonly known as:  VITAMIN D2     multivitamin with minerals Tabs tablet      TAKE these medications        acetaminophen 500 MG tablet  Commonly known as:  TYLENOL  Take 1,000 mg by mouth every 6 (six) hours as needed.     bisacodyl 10 MG suppository  Commonly known as:  DULCOLAX  Place 1 suppository (10 mg total) rectally daily as needed for moderate constipation.     diphenhydrAMINE 12.5 MG/5ML elixir  Commonly known as:  BENADRYL  Take 5-10 mLs (12.5-25 mg total) by mouth every 4 (four) hours as needed for itching.     docusate sodium 100 MG capsule  Commonly known as:  COLACE  Take 1 capsule (100 mg total) by mouth 2 (two) times daily.     methocarbamol 500 MG tablet  Commonly known as:  ROBAXIN  Take 1 tablet (500 mg total) by mouth every 6 (six) hours as needed for muscle spasms.     metoCLOPramide 5 MG tablet  Commonly known as:  REGLAN  Take 1 tablet (5 mg total) by mouth every 8 (eight) hours as needed for nausea (if ondansetron (ZOFRAN) ineffective.).     ondansetron 4 MG tablet  Commonly known as:  ZOFRAN  Take 1 tablet (4 mg total) by mouth every 6 (six) hours as needed for nausea.     polyethylene glycol packet  Commonly known as:  MIRALAX / GLYCOLAX  Take 17 g by mouth daily as needed for mild constipation.     ranitidine 150 MG tablet  Commonly known as:  ZANTAC  Take 150 mg by mouth 2 (two) times daily.     rivaroxaban 10 MG Tabs tablet  Commonly known as:  XARELTO  Take 1 tablet (10 mg total) by mouth daily with breakfast. Take Xarelto for two and a half more weeks, then discontinue Xarelto. Once the patient has completed the blood thinner regimen, then take a Baby 81 mg Aspirin daily for three more weeks.     sodium phosphate 7-19 GM/118ML Enem  Place 133 mLs (1 enema total) rectally once as needed for severe constipation.     traMADol 50 MG tablet  Commonly known as:  ULTRAM  Take 1-2 tablets (50-100 mg total) by mouth every 6 (six)  hours as needed (mild pain).           Follow-up Information    Follow up with Gearlean Alf, MD. Schedule an appointment as soon as possible for a visit on 11/26/2015.   Specialty:  Orthopedic Surgery   Why:  Call office ASAP at [phone] to setup appointment on Thursday 11/26/2015   Contact information:  [address] [place] [address] [phone]       Signed: Arlee Muslim, PA-C Orthopaedic Surgery 11/13/2015, 2:34 PM

## 2015-11-13 NOTE — Progress Notes (Signed)
   Subjective: 2 Days Post-Op Procedure(s) (LRB): RIGHT TOTAL HIP ARTHROPLASTY ANTERIOR APPROACH (Right) Patient reports pain as mild.   Patient seen in rounds by Dr. Lequita Halt. Patient is well, and has had no acute complaints or problems Patient is ready to go to the SNF  Objective: Vital signs in last 24 hours: Temp:  [97.8 F (36.6 C)-98.7 F (37.1 C)] 98.3 F (36.8 C) (01/27 0556) Pulse Rate:  [78-90] 90 (01/27 0556) Resp:  [15-16] 16 (01/27 0556) BP: (98-128)/(48-67) 98/48 mmHg (01/27 0911) SpO2:  [95 %-100 %] 95 % (01/27 0556)  Intake/Output from previous day:  Intake/Output Summary (Last 24 hours) at 11/13/15 1420 Last data filed at 11/12/15 1903  Gross per 24 hour  Intake    360 ml  Output    700 ml  Net   -340 ml    Labs:  Recent Labs  11/12/15 0420 11/13/15 0448  HGB 9.4* 9.7*    Recent Labs  11/12/15 0420 11/13/15 0448  WBC 9.2 10.0  RBC 3.18* 3.23*  HCT 28.9* 29.5*  PLT 241 252    Recent Labs  11/12/15 0420 11/13/15 0448  NA 141 143  K 4.3 4.0  CL 107 105  CO2 26 29  BUN 12 14  CREATININE 0.77 0.74  GLUCOSE 139* 113*  CALCIUM 8.8* 9.2   No results for input(s): LABPT, INR in the last 72 hours.  EXAM: General - Patient is Alert, Appropriate and Oriented Extremity - Neurovascular intact Sensation intact distally Dorsiflexion/Plantar flexion intact Incision - clean, dry Motor Function - intact, moving foot and toes well on exam.   Assessment/Plan: 2 Days Post-Op Procedure(s) (LRB): RIGHT TOTAL HIP ARTHROPLASTY ANTERIOR APPROACH (Right) Procedure(s) (LRB): RIGHT TOTAL HIP ARTHROPLASTY ANTERIOR APPROACH (Right) Past Medical History  Diagnosis Date  . IBS (irritable bowel syndrome)   . Fracture of foot 08/2013    left  . Hx of seasonal allergies   . Migraine headache     better since off OCP 07/2010. no longer a problem after menopause  . Arthritis     osteoarthritis- hip ,bilateral  . Bleeding nose     freq occ. -during  childhood and related to sinus infection or dryness-less frequent now.  . Complication of anesthesia     Nausea and vomiting w Ether  . PONV (postoperative nausea and vomiting)    Principal Problem:   OA (osteoarthritis) of hip  Estimated body mass index is 25.25 kg/(m^2) as calculated from the following:   Height as of this encounter:  (1.778 m).   Weight as of this encounter: 79.833 kg (176 lb). Up with therapy Discharge to SNF Diet - Regular diet Follow up - in 2 weeks Activity - WBAT Disposition - Skilled nursing facility Condition Upon Discharge - Good D/C Meds - See DC Summary DVT Prophylaxis - Xarelto  Avel Peace, PA-C Orthopaedic Surgery 11/13/2015, 2:20 PM

## 2015-11-13 NOTE — Progress Notes (Signed)
Physical Therapy Treatment Patient Details Name: Amanda Washington MRN: [id number] DOB: [birth year] Today's Date: 11/13/2015    History of Present Illness s/p R DA THA    PT Comments    POD # 2 am session Pt required increased time and assist to get to EOB due to increased c/o "tightness" and "soreness".  Decreased amb distance due to MAX c/o dizziness and pt appeared pale/unsteady.  BP 98/48.  RN called to room.  Placed in recliner and performed some THR TE's AAROM then applied ICE.   Pt progressing slowly with issues of dizziness and gait instability.  Pt will need ST Rehab at SNF prior to returning home.  Follow Up Recommendations  SNF     Equipment Recommendations  None recommended by PT    Recommendations for Other Services       Precautions / Restrictions Precautions Precautions: Fall Precaution Comments: monitor BP Restrictions Weight Bearing Restrictions: No Other Position/Activity Restrictions: WBAT    Mobility  Bed Mobility Overal bed mobility: Needs Assistance Bed Mobility: Supine to Sit     Supine to sit: Min assist     General bed mobility comments: increased time and assist with R LE due to increased c/o pain and esp tightness  Transfers Overall transfer level: Needs assistance Equipment used: Rolling walker (2 wheeled) Transfers: Sit to/from Stand Sit to Stand: Min assist         General transfer comment: increased assist due to increased c/o R hip "tightness" and "soreness".   Ambulation/Gait Ambulation/Gait assistance: Min assist Ambulation Distance (Feet): 36 Feet Assistive device: Rolling walker (2 wheeled) Gait Pattern/deviations: Step-to pattern;Decreased stance time - right Gait velocity: decreased   General Gait Details: limited gait distance due to MAX c/o feeling "dizzy", pt became pale and demonstarted increased unsteadiness.  RN called to room BP was 98/48.     Stairs            Wheelchair Mobility    Modified Rankin  (Stroke Patients Only)       Balance                                    Cognition Arousal/Alertness: Awake/alert Behavior During Therapy: WFL for tasks assessed/performed Overall Cognitive Status: Within Functional Limits for tasks assessed                      Exercises   Total Hip Replacement TE's 10 reps ankle pumps 10 reps knee presses 10 reps heel slides AAROM 10 reps ABD AAROM Followed by ICE     General Comments        Pertinent Vitals/Pain Pain Assessment: 0-10 Pain Score: 3  Pain Location: R hip with amb and 7/10 with TE's Pain Descriptors / Indicators: Sore;Tightness Pain Intervention(s): Monitored during session;Premedicated before session;Repositioned;Ice applied    Home Living                      Prior Function            PT Goals (current goals can now be found in the care plan section) Progress towards PT goals: Progressing toward goals    Frequency  7X/week    PT Plan Current plan remains appropriate    Co-evaluation             End of Session Equipment Utilized During Treatment: Gait belt Activity Tolerance: Treatment limited secondary to  medical complications (Comment);Patient limited by fatigue;Patient limited by pain (Hypotension)       Time: 1610-9604 PT Time Calculation (min) (ACUTE ONLY): 25 min  Charges:  $Gait Training: 8-22 mins $Therapeutic Exercise: 8-22 mins                    G Codes:      Armando Reichert 2015-11-19, 9:49 AM

## 2015-11-13 NOTE — Clinical Social Work Placement (Signed)
   CLINICAL SOCIAL WORK PLACEMENT  NOTE  Date:  11/13/2015  Patient Details  Name: Amanda Washington MRN: [id number] Date of Birth: Sep 27, 1956  Clinical Social Work is seeking post-discharge placement for this patient at the Skilled  Nursing Facility level of care (*CSW will initial, date and re-position this form in  chart as items are completed):  Yes   Patient/family provided with [facility] Clinical Social Work Department's list of facilities offering this level of care within the geographic area requested by the patient (or if unable, by the patient's family).  Yes   Patient/family informed of their freedom to choose among providers that offer the needed level of care, that participate in Medicare, Medicaid or managed care program needed by the patient, have an available bed and are willing to accept the patient.  Yes   Patient/family informed of [facility]'s ownership interest in [facility] and [facility], as well as of the fact that they are under no obligation to receive care at these facilities.  PASRR submitted to EDS on 11/12/15     PASRR number received on 11/12/15     Existing PASRR number confirmed on       FL2 transmitted to all facilities in geographic area requested by pt/family on 11/12/15     FL2 transmitted to all facilities within larger geographic area on       Patient informed that his/her managed care company has contracts with or will negotiate with certain facilities, including the following:        Yes   Patient/family informed of bed offers received.  Patient chooses bed at [facility]     Physician recommends and patient chooses bed at [facility]    Patient to be transferred to [facility] on 11/13/15.  Patient to be transferred to facility by CAR     Patient family notified on 11/13/15 of transfer.  Name of family member notified:  Pt contacted family directly.     PHYSICIAN       Additional Comment: Pt is in agreement with  d/c to [facility] today. PT has approved transport by car. [employer] has provided authorization for transport. NSG has reviewed d/c summary, scripts, avs. Scripts included in d/c packet. D/C summary has been sent to SNF for review prior to d/c. D/C packet provided to pt prior to d/c.   _______________________________________________ Royetta Asal, LCSW [phone] 11/13/2015, 3:36 PM

## 2015-11-16 ENCOUNTER — Encounter: Payer: Self-pay | Admitting: Internal Medicine

## 2015-11-16 ENCOUNTER — Non-Acute Institutional Stay (SKILLED_NURSING_FACILITY): Payer: BC Managed Care – PPO | Admitting: Internal Medicine

## 2015-11-16 DIAGNOSIS — M1611 Unilateral primary osteoarthritis, right hip: Secondary | ICD-10-CM

## 2015-11-16 DIAGNOSIS — K219 Gastro-esophageal reflux disease without esophagitis: Secondary | ICD-10-CM

## 2015-11-16 DIAGNOSIS — R195 Other fecal abnormalities: Secondary | ICD-10-CM | POA: Diagnosis not present

## 2015-11-16 DIAGNOSIS — D62 Acute posthemorrhagic anemia: Secondary | ICD-10-CM | POA: Diagnosis not present

## 2015-11-16 DIAGNOSIS — R2681 Unsteadiness on feet: Secondary | ICD-10-CM

## 2015-11-16 LAB — FECAL OCCULT BLOOD, IMMUNOCHEMICAL: IMMUNOLOGICAL FECAL OCCULT BLOOD TEST: NEGATIVE

## 2015-11-16 NOTE — Addendum Note (Signed)
Addended by: Dion Body on: 11/16/2015 03:59 PM   Modules accepted: Orders

## 2015-11-16 NOTE — Progress Notes (Signed)
Patient ID: Amanda Washington, female   DOB: [birth year], 60 y.o.   MRN: [id number]     Amanda Washington  PCP: Amanda Hector, MD  Code Status: Full Code   Allergies  Allergen Reactions  . Amoxicillin     Pt can't remember possibly rash   . Asa [Aspirin]     Nose bleeds as a child  . Penicillins     Has patient had a PCN reaction causing immediate rash, facial/tongue/throat swelling, SOB or lightheadedness with hypotension: No Has patient had a PCN reaction causing severe rash involving mucus membranes or skin necrosis: No Has patient had a PCN reaction that required hospitalization No Has patient had a PCN reaction occurring within the last 10 years: No If all of the above answers are "NO", then may proceed with Cephalosporin use.    Chief Complaint  Patient presents with  . New Admit To SNF    New Admission      HPI:  60 y.o. patient is here for short term rehabilitation post hospital admission from 11/11/15-11/13/15 with right hip OA. She underwent right total hip arthroplasty. She is seen in her room today. She complaints of some nausea and occasional dizziness with position change. No other concerns. She has had some loose stool today and yesterday. Denies blood or mucus in it. Her pain is under control with current pain regimen.  Review of Systems:  Constitutional: Negative for fever, chills, diaphoresis.  HENT: Negative for headache, congestion, nasal discharge, difficulty swallowing.   Eyes: Negative for eye pain, blurred vision, double vision and discharge.  Respiratory: Negative for cough, shortness of breath and wheezing.   Cardiovascular: Negative for chest pain, palpitations, leg swelling.  Gastrointestinal: Negative for heartburn, vomiting, abdominal pain Genitourinary: Negative for dysuria and flank pain.  Musculoskeletal: Negative for back pain, falls Skin: Negative for itching, rash.  Neurological: Negative for  dizziness Psychiatric/Behavioral: Negative for depression   Past Medical History  Diagnosis Date  . Amanda Washington (irritable bowel syndrome)   . Fracture of foot 08/2013    left  . Hx of seasonal allergies   . Migraine headache     better since off OCP 07/2010. no longer a problem after menopause  . Arthritis     osteoarthritis- hip ,bilateral  . Bleeding nose     freq occ. -during childhood and related to sinus infection or dryness-less frequent now.  . Complication of anesthesia     Nausea and vomiting w Amanda Washington  . PONV (postoperative nausea and vomiting)    Past Surgical History  Procedure Laterality Date  . Appendectomy      child-   . Total hip arthroplasty Right 11/11/2015    Procedure: RIGHT TOTAL HIP ARTHROPLASTY ANTERIOR APPROACH;  Surgeon: Amanda Gross, MD;  Location: [facility];  Service: Orthopedics;  Laterality: Right;   Social History:   reports that she has never smoked. She has never used smokeless tobacco. She reports that she drinks alcohol. She reports that she does not use illicit drugs.  Family History  Problem Relation Age of Onset  . Amanda Washington Mother 74  . Amanda Washington Father 69  . Amanda Washington Father   . Amanda Washington Maternal Grandmother 90    Medications:   Medication List       This list is accurate as of: 11/16/15 11:03 AM.  Always use your most recent med list.               acetaminophen 500 MG tablet  Commonly known as:  Amanda Washington  Take 1,000 mg by mouth every 6 (six) hours as needed.     bisacodyl 10 MG suppository  Commonly known as:  Amanda Washington  Place 1 suppository (10 mg total) rectally daily as needed for moderate constipation.     diphenhydrAMINE 12.5 MG/5ML elixir  Commonly known as:  Amanda Washington  Take 5-10 mLs (12.5-25 mg total) by mouth every 4 (four) hours as needed for itching.     docusate sodium 100 MG capsule  Commonly known as:  Amanda Washington  Take 1 capsule (100 mg total) by mouth 2 (two) times daily.     methocarbamol  500 MG tablet  Commonly known as:  Amanda Washington  Take 1 tablet (500 mg total) by mouth every 6 (six) hours as needed for muscle spasms.     metoCLOPramide 5 MG tablet  Commonly known as:  Amanda Washington  Take 1 tablet (5 mg total) by mouth every 8 (eight) hours as needed for nausea (if ondansetron (Amanda Washington) ineffective.).     ondansetron 4 MG tablet  Commonly known as:  Amanda Washington  Take 1 tablet (4 mg total) by mouth every 6 (six) hours as needed for nausea.     polyethylene glycol packet  Commonly known as:  Amanda Washington / Amanda Washington  Take 17 g by mouth daily as needed for mild constipation.     ranitidine 150 MG tablet  Commonly known as:  Amanda Washington  Take 150 mg by mouth 2 (two) times daily.     rivaroxaban 10 MG Tabs tablet  Commonly known as:  Amanda Washington  Take 1 tablet (10 mg total) by mouth daily with breakfast. Take Amanda Washington for two and a half more weeks, then discontinue Amanda Washington. Once the patient has completed the blood thinner regimen, then take a Baby 81 mg Aspirin daily for three more weeks.     traMADol 50 MG tablet  Commonly known as:  Amanda Washington  Take 1-2 tablets (50-100 mg total) by mouth every 6 (six) hours as needed (mild pain).         Physical Exam: Filed Vitals:   11/16/15 1048  BP: 140/78  Pulse: 104  Temp: 97.8 F (36.6 C)  TempSrc: Oral  Resp: 18  Height:  (1.778 m)  Weight: 176 lb (79.833 kg)  SpO2: 98%    General- adult female, well built, in no acute distress Head- normocephalic, atraumatic Nose- no maxillary or frontal sinus tenderness, no nasal discharge Throat- moist mucus membrane Eyes- PERRLA, EOMI, no pallor, no icterus, no discharge, normal conjunctiva, normal sclera Neck- no cervical lymphadenopathy Cardiovascular- normal s1,s2, no murmurs, palpable dorsalis pedis and radial pulses, no leg edema Respiratory- bilateral clear to auscultation, no wheeze, no rhonchi, no crackles, no use of accessory muscles Abdomen- bowel sounds present, soft, non  tender Musculoskeletal- able to move all 4 extremities, limited right hip range of motion  Neurological- no focal deficit, alert and oriented to person, place and time Skin- warm and dry, right hip surgical dressing with steristrip in place Psychiatry- normal mood and affect    Labs reviewed: Basic Metabolic Panel:  Recent Labs  40/98/11 1415 11/12/15 0420 11/13/15 0448  NA 138 141 143  K 4.0 4.3 4.0  CL 103 107 105  CO2 GLUCOSE 100* 139* 113*  BUN CREATININE 0.71 0.77 0.74  CALCIUM 9.3 8.8* 9.2   Liver Function Tests:  Recent Labs  11/05/15 1415  AST 23  ALT 18  ALKPHOS 76  BILITOT 0.7  PROT 7.2  ALBUMIN 4.3   No results for input(s): LIPASE, AMYLASE in the last 8760 hours. No results for input(s): AMMONIA in the last 8760 hours. CBC:  Recent Labs  11/05/15 1415 11/12/15 0420 11/13/15 0448  WBC 6.4 9.2 10.0  HGB 11.9* 9.4* 9.7*  HCT 36.2 28.9* 29.5*  MCV 88.9 90.9 91.3  PLT 260 241 252    Assessment/Plan  Unsteady gait S/p right hip surgery. Will have patient work with PT/OT as tolerated to regain strength and restore function.  Fall precautions are in place.  Right hip OA S/p right hip arthroplasty. Has f/u with orthopedics. Will have her work with physical therapy and occupational therapy team to help with gait training and muscle strengthening exercises.fall precautions. Skin care. Encourage to be out of bed. WBAT. Continue Amanda Washington for dvt prophylaxis. Continue Amanda Washington as needed for pain along with prn tramadol for severe pain. Continue Amanda Washington 500 mg q6h prn muscle spasm.   Blood loss anemia Post op, monitor cbc  Gerd Stable. Change Amanda Washington to 150 mg daily with her symptom under control  Loose stool Has hx of Amanda Washington. Change Amanda Washington to 100 mg bid prn along with Amanda Washington prn only. Hydration to be maintained. Has not been on any antibiotic recently. Start imodium 2 mg x 1 now and then q8h prn for > 2 loose stools episodes.   Goals  of care: short term rehabilitation   Labs/tests ordered: cbc, bmp 11/17/15  Family/ staff Communication: reviewed care plan with patient and nursing supervisor    Oneal Grout, MD  [facility] [phone] (Monday-Friday 8 am - 5 pm) [phone] (afterhours)

## 2015-11-18 LAB — CBC AND DIFFERENTIAL
HEMATOCRIT: 30 % — AB (ref 36–46)
Hemoglobin: 9.8 g/dL — AB (ref 12.0–16.0)
Neutrophils Absolute: 5 /uL
Platelets: 415 10*3/uL — AB (ref 150–399)
WBC: 7.8 10^3/mL

## 2015-11-19 DIAGNOSIS — Z471 Aftercare following joint replacement surgery: Secondary | ICD-10-CM | POA: Diagnosis not present

## 2015-11-19 DIAGNOSIS — Z96641 Presence of right artificial hip joint: Secondary | ICD-10-CM | POA: Diagnosis not present

## 2015-11-19 DIAGNOSIS — K589 Irritable bowel syndrome without diarrhea: Secondary | ICD-10-CM | POA: Diagnosis not present

## 2015-11-20 ENCOUNTER — Encounter: Payer: Self-pay | Admitting: Adult Health

## 2015-11-20 ENCOUNTER — Non-Acute Institutional Stay (SKILLED_NURSING_FACILITY): Payer: BC Managed Care – PPO | Admitting: Adult Health

## 2015-11-20 DIAGNOSIS — K219 Gastro-esophageal reflux disease without esophagitis: Secondary | ICD-10-CM

## 2015-11-20 DIAGNOSIS — M1611 Unilateral primary osteoarthritis, right hip: Secondary | ICD-10-CM | POA: Diagnosis not present

## 2015-11-20 DIAGNOSIS — K589 Irritable bowel syndrome without diarrhea: Secondary | ICD-10-CM

## 2015-11-20 DIAGNOSIS — D62 Acute posthemorrhagic anemia: Secondary | ICD-10-CM | POA: Diagnosis not present

## 2015-11-20 NOTE — Progress Notes (Signed)
Patient ID: Amanda Washington, female   DOB: 05/15/1956, 60 y.o.   MRN: [id number]     DATE:  11/20/15  MRN:  [id number]  BIRTHDAY: [birth year]  Facility:  Nursing Home Location:  [facility]  Nursing Home Room Number: 602-P  LEVEL OF CARE:  SNF (31)  Contact Information    Name Relation Home Work Mobile   [place] Friend [phone]     No name specified           Code Status History    Date Active Date Inactive Code Status Order ID Comments User Context   11/11/2015  4:08 PM 11/13/2015  7:32 PM Full Code [id number]  Ollen Gross, MD Inpatient       Chief Complaint  Patient presents with  . Discharge Note    HISTORY OF PRESENT ILLNESS:  This is a 60 year old female who who is for discharge home with home health PT for endurance and OT for ADLs. She has been admitted to [facility] on 11/12/68 from [facility] with right hip osteoarthritis for which she had right total hip arthroplasty.  Patient was admitted to this facility for short-term rehabilitation after the patient's recent hospitalization.  Patient has completed SNF rehabilitation and therapy has cleared the patient for discharge.  PAST MEDICAL HISTORY:  Past Medical History  Diagnosis Date  . IBS (irritable bowel syndrome)   . Fracture of foot 08/2013    left  . Hx of seasonal allergies   . Migraine headache     better since off OCP 07/2010. no longer a problem after menopause  . Arthritis     osteoarthritis- hip ,bilateral  . Bleeding nose     freq occ. -during childhood and related to sinus infection or dryness-less frequent now.  . Complication of anesthesia     Nausea and vomiting w Ether  . PONV (postoperative nausea and vomiting)   . Unsteady gait   . Primary osteoarthritis of right hip   . Acute blood loss anemia   . Loose stools   . GERD (gastroesophageal reflux disease)      CURRENT MEDICATIONS: Reviewed  Patient's Medications  New Prescriptions   No  medications on file  Previous Medications   ACETAMINOPHEN (TYLENOL) 500 MG TABLET    Take 1,000 mg by mouth every 6 (six) hours as needed.   BISACODYL (DULCOLAX) 10 MG SUPPOSITORY    Place 1 suppository (10 mg total) rectally daily as needed for moderate constipation.   DIPHENHYDRAMINE (BENADRYL) 12.5 MG/5ML ELIXIR    Take 5-10 mLs (12.5-25 mg total) by mouth every 4 (four) hours as needed for itching.   DOCUSATE SODIUM (COLACE) 100 MG CAPSULE    Take 1 capsule (100 mg total) by mouth 2 (two) times daily.   LOPERAMIDE (IMODIUM A-D) 2 MG TABLET    Take 2 mg by mouth every 8 (eight) hours. For >2 loose stools for 5 days   METHOCARBAMOL (ROBAXIN) 500 MG TABLET    Take 1 tablet (500 mg total) by mouth every 6 (six) hours as needed for muscle spasms.   METOCLOPRAMIDE (REGLAN) 5 MG TABLET    Take 1 tablet (5 mg total) by mouth every 8 (eight) hours as needed for nausea (if ondansetron (ZOFRAN) ineffective.).   ONDANSETRON (ZOFRAN) 4 MG TABLET    Take 1 tablet (4 mg total) by mouth every 6 (six) hours as needed for nausea.   POLYETHYLENE GLYCOL (MIRALAX / GLYCOLAX) PACKET    Take 17 g  by mouth daily as needed for mild constipation.   RANITIDINE (ZANTAC) 150 MG TABLET    Take 150 mg by mouth daily.    RIVAROXABAN (XARELTO) 10 MG TABS TABLET    Take 1 tablet (10 mg total) by mouth daily with breakfast. Take Xarelto for two and a half more weeks, then discontinue Xarelto. Once the patient has completed the blood thinner regimen, then take a Baby 81 mg Aspirin daily for three more weeks.   TRAMADOL (ULTRAM) 50 MG TABLET    Take 1-2 tablets (50-100 mg total) by mouth every 6 (six) hours as needed (mild pain).  Modified Medications   No medications on file  Discontinued Medications   No medications on file     Allergies  Allergen Reactions  . Amoxicillin     Pt can't remember possibly rash   . Asa [Aspirin]     Nose bleeds as a child  . Penicillins     Has patient had a PCN reaction causing immediate  rash, facial/tongue/throat swelling, SOB or lightheadedness with hypotension: No Has patient had a PCN reaction causing severe rash involving mucus membranes or skin necrosis: No Has patient had a PCN reaction that required hospitalization No Has patient had a PCN reaction occurring within the last 10 years: No If all of the above answers are "NO", then may proceed with Cephalosporin use.     REVIEW OF SYSTEMS:  GENERAL: no change in appetite, no fatigue, no weight changes, no fever, chills or weakness EYES: Denies change in vision, dry eyes, eye pain, itching or discharge EARS: Denies change in hearing, ringing in ears, or earache NOSE: Denies nasal congestion or epistaxis MOUTH and THROAT: Denies oral discomfort, gingival pain or bleeding, pain from teeth or hoarseness   RESPIRATORY: no cough, SOB, DOE, wheezing, hemoptysis CARDIAC: no chest pain, edema or palpitations GI: no abdominal pain, diarrhea, constipation, heart burn, nausea or vomiting GU: Denies dysuria, frequency, hematuria, incontinence, or discharge PSYCHIATRIC: Denies feeling of depression or anxiety. No report of hallucinations, insomnia, paranoia, or agitation    PHYSICAL EXAMINATION  GENERAL APPEARANCE: Well nourished. In no acute distress. Normal body habitus SKIN:  Right hip surgical incision has Steri-Strips and covered with dry dressing, no erythema HEAD: Normal in size and contour. No evidence of trauma EYES: Lids open and close normally. No blepharitis, entropion or ectropion. PERRL. Conjunctivae are clear and sclerae are white. Lenses are without opacity EARS: Pinnae are normal. Patient hears normal voice tunes of the examiner MOUTH and THROAT: Lips are without lesions. Oral mucosa is moist and without lesions. Tongue is normal in shape, size, and color and without lesions NECK: supple, trachea midline, no neck masses, no thyroid tenderness, no thyromegaly LYMPHATICS: no LAN in the neck, no supraclavicular  LAN RESPIRATORY: breathing is even & unlabored, BS CTAB CARDIAC: RRR, no murmur,no extra heart sounds, no edema GI: abdomen soft, normal BS, no masses, no tenderness, no hepatomegaly, no splenomegaly EXTREMITIES:  Able to move 4 extremities PSYCHIATRIC: Alert and oriented X 3. Affect and behavior are appropriate  LABS/RADIOLOGY: Labs reviewed: 09/16/16  WBC 7.8 hemoglobin 9.8 hematocrit 30.4 MCV 89.9 platelet 415 sodium 138 potassium 4.7 EUS 15 creatinine 0.69 calcium 9.1 Basic Metabolic Panel:  Recent Labs  19/14/78 1415 11/12/15 0420 11/13/15 0448  NA 138 141 143  K 4.0 4.3 4.0  CL 103 107 105  CO2 GLUCOSE 100* 139* 113*  BUN CREATININE 0.71 0.77 0.74  CALCIUM  9.3 8.8* 9.2   Liver Function Tests:  Recent Labs  11/05/15 1415  AST 23  ALT 18  ALKPHOS 76  BILITOT 0.7  PROT 7.2  ALBUMIN 4.3    CBC:  Recent Labs  11/05/15 1415 11/12/15 0420 11/13/15 0448 11/18/15  WBC 6.4 9.2 10.0 7.8  NEUTROABS  --   --   --  5  HGB 11.9* 9.4* 9.7* 9.8*  HCT 36.2 28.9* 29.5* 30*  MCV 88.9 90.9 91.3  --   PLT 260 241 252 415*   Lipid Panel:  Recent Labs  10/28/15 0909  HDL 71     ASSESSMENT/PLAN:  Osteoarthritis s/p right total hip arthroplasty - for home health PT and OT; RLE WBAT; continue tramadol 50 mg 1-2 tabs by mouth every 6 hours when necessary and acetaminophen 500 mg 1 tab by mouth every 6 hours when necessary for pain; Robaxin 500 mg 1 tab by mouth every 6 hours when necessary for muscle spasm; Xarelto 10 mg 1 tab by mouth daily times 1  1/2 weeks then aspirin 81 mg 1 tab by mouth daily 3 weeks for DVT prophylaxis; follow-up with orthopedics  Anemia, acute blood loss - hemoglobin 9.8; stable  GERD - stable; continue Zantac 150 mg 1 tab by mouth daily  IBS - continue Dulcolax suppository when necessary, MiraLAX when necessary and Imodium when necessary      I have filled out patient's discharge paperwork and written  prescriptions.  Patient will receive home health PT and OT.  Total discharge time: Less than 30 minutes  Discharge time involved coordination of the discharge process with Child psychotherapist, nursing staff and therapy department. Medical justification for home health services verified.      [facility], NP [employer] [phone]

## 2015-11-30 ENCOUNTER — Telehealth: Payer: Self-pay | Admitting: *Deleted

## 2015-11-30 NOTE — Telephone Encounter (Signed)
-----   Message from Ria Comment, FNP sent at 11/16/2015  5:44 PM EST ----- Please let pt know that IFOB is negative.

## 2015-11-30 NOTE — Telephone Encounter (Signed)
I have attempted to contact this patient by phone with the following results: left message to return call to [place] at [phone] answering machine (home per [facility]). [phone] (Home)

## 2015-12-02 NOTE — Telephone Encounter (Signed)
Pt notified in result note.  Closing encounter. 

## 2016-05-03 ENCOUNTER — Encounter: Payer: Self-pay | Admitting: Nurse Practitioner

## 2016-07-17 HISTORY — PX: TOTAL HIP ARTHROPLASTY: SHX124

## 2016-10-31 ENCOUNTER — Encounter: Payer: Self-pay | Admitting: Nurse Practitioner

## 2016-10-31 ENCOUNTER — Ambulatory Visit (INDEPENDENT_AMBULATORY_CARE_PROVIDER_SITE_OTHER): Payer: BC Managed Care – PPO | Admitting: Nurse Practitioner

## 2016-10-31 VITALS — BP 136/94 | HR 72 | Ht 69.0 in | Wt 179.0 lb

## 2016-10-31 DIAGNOSIS — Z23 Encounter for immunization: Secondary | ICD-10-CM

## 2016-10-31 DIAGNOSIS — Z0000 Encounter for general adult medical examination without abnormal findings: Secondary | ICD-10-CM | POA: Diagnosis not present

## 2016-10-31 DIAGNOSIS — Z01411 Encounter for gynecological examination (general) (routine) with abnormal findings: Secondary | ICD-10-CM

## 2016-10-31 DIAGNOSIS — Z96643 Presence of artificial hip joint, bilateral: Secondary | ICD-10-CM

## 2016-10-31 DIAGNOSIS — E559 Vitamin D deficiency, unspecified: Secondary | ICD-10-CM

## 2016-10-31 LAB — CBC
HCT: 41.1 % (ref 35.0–45.0)
HEMOGLOBIN: 13.3 g/dL (ref 11.7–15.5)
MCH: 27.6 pg (ref 27.0–33.0)
MCHC: 32.4 g/dL (ref 32.0–36.0)
MCV: 85.3 fL (ref 80.0–100.0)
MPV: 8.8 fL (ref 7.5–12.5)
PLATELETS: 305 10*3/uL (ref 140–400)
RBC: 4.82 MIL/uL (ref 3.80–5.10)
RDW: 15.8 % — ABNORMAL HIGH (ref 11.0–15.0)
WBC: 6.2 10*3/uL (ref 3.8–10.8)

## 2016-10-31 LAB — POCT URINALYSIS DIPSTICK
Bilirubin, UA: NEGATIVE
Blood, UA: NEGATIVE
Glucose, UA: NEGATIVE
Ketones, UA: NEGATIVE
LEUKOCYTES UA: NEGATIVE
NITRITE UA: NEGATIVE
PH UA: 7
PROTEIN UA: NEGATIVE
UROBILINOGEN UA: NEGATIVE

## 2016-10-31 LAB — LIPID PANEL
CHOL/HDL RATIO: 2.7 ratio (ref <5.0)
CHOLESTEROL: 193 mg/dL (ref <200)
HDL: 72 mg/dL (ref >50)
LDL Cholesterol: 107 mg/dL — ABNORMAL HIGH (ref <100)
Triglycerides: 72 mg/dL (ref <150)
VLDL: 14 mg/dL (ref <30)

## 2016-10-31 LAB — TSH: TSH: 2 mIU/L

## 2016-10-31 LAB — HEMOGLOBIN A1C
HEMOGLOBIN A1C: 5.8 % — AB (ref <5.7)
Mean Plasma Glucose: 120 mg/dL

## 2016-10-31 NOTE — Patient Instructions (Signed)

## 2016-10-31 NOTE — Progress Notes (Signed)
Patient ID: [name], female   DOB: [birth year], 61 y.o.   MRN: [id number]  61 y.o. G0P0000 Divorced  Caucasian Fe here for annual exam. This past year had bilateral hip replacements.  Right in 1/17 and left 06/2016. She is doing well. Not SA or dating.  She did go to the NH after first hip but not the second hip replacement.  Patient's last menstrual period was 05/30/2010 (exact date).          Sexually active: No.  The current method of family planning is none and abstinence.    Exercising: No.  The patient does not participate in regular exercise at present. Smoker:  no  Health Maintenance: Pap:10/28/15, Negative with neg HR HPV MMG:04/29/16, 3D, Bi-Rads 1: Negative   Colonoscopy:09/07/07, normal, repeat in 10 years BMD: 04/24/15 T Score: 1.2 Spine / -1.1 Right Femur Neck / -1.2 Left Femur Neck TDaP: 04/2007 Hep C and HIV: 10/28/15 Labs: HB: 12.9   Urine: negative   reports that she has never smoked. She has never used smokeless tobacco. She reports that she drinks about 0.6 - 1.2 oz of alcohol per week . She reports that she does not use drugs.  Past Medical History:  Diagnosis Date  . Acute blood loss anemia   . Arthritis    osteoarthritis- hip ,bilateral  . Bleeding nose    freq occ. -during childhood and related to sinus infection or dryness-less frequent now.  . Complication of anesthesia    Nausea and vomiting w Ether  . Fracture of foot 08/2013   left  . GERD (gastroesophageal reflux disease)   . Hx of seasonal allergies   . IBS (irritable bowel syndrome)   . Loose stools   . Migraine headache    better since off OCP 07/2010. no longer a problem after menopause  . PONV (postoperative nausea and vomiting)   . Primary osteoarthritis of right hip   . Unsteady gait     Past Surgical History:  Procedure Laterality Date  . APPENDECTOMY     child-   . TOTAL HIP ARTHROPLASTY Right 11/11/2015   Procedure: RIGHT TOTAL HIP ARTHROPLASTY ANTERIOR APPROACH;  Surgeon:  Ollen GrossFrank Aluisio, MD;  Location: [facility];  Service: Orthopedics;  Laterality: Right;  . TOTAL HIP ARTHROPLASTY Left 07/2016   Alusio    Current Outpatient Prescriptions  Medication Sig Dispense Refill  . acetaminophen (TYLENOL) 500 MG tablet Take 1,000 mg by mouth every 6 (six) hours as needed.    . polyethylene glycol (MIRALAX / GLYCOLAX) packet Take 17 g by mouth daily as needed for mild constipation. 14 each 0   No current facility-administered medications for this visit.     Family History  Problem Relation Age of Onset  . Alzheimer's disease Mother 3559  . Prostate cancer Father [phone]  . Hyperlipidemia Father   . Breast cancer Maternal Grandmother 90    ROS:  Pertinent items are noted in HPI.  Otherwise, a comprehensive ROS was negative.  Exam:   BP (!) 136/94 (BP Location: Right Arm, Patient Position: Sitting, Cuff Size: Normal)   Pulse 72   Ht 5\' 9"  (1.753 m)   Wt 179 lb (81.2 kg)   LMP 05/30/2010 (Exact Date)   BMI 26.43 kg/m  Height: 5\' 9"  (175.3 cm) Ht Readings from Last 3 Encounters:  10/31/16 5\' 9"  (1.753 m)  11/20/15 5\' 10"  (1.778 m)  11/16/15 5\' 10"  (1.778 m)    General appearance: alert, cooperative and appears stated age Head:  Normocephalic, without obvious abnormality, atraumatic Neck: no adenopathy, supple, symmetrical, trachea midline and thyroid normal to inspection and palpation Lungs: clear to auscultation bilaterally Breasts: normal appearance, no masses or tenderness Heart: regular rate and rhythm Abdomen: soft, non-tender; no masses,  no organomegaly Extremities: extremities normal, atraumatic, no cyanosis or edema Skin: Skin color, texture, turgor normal. No rashes or lesions Lymph nodes: Cervical, supraclavicular, and axillary nodes normal. No abnormal inguinal nodes palpated Neurologic: Grossly normal   Pelvic: External genitalia:  no lesions              Urethra:  normal appearing urethra with no masses, tenderness or lesions               Bartholin's and Skene's: normal                 Vagina: normal appearing vagina with normal color and discharge, no lesions              Cervix: anteverted              Pap taken: No. Bimanual Exam:  Uterus:  normal size, contour, position, consistency, mobility, non-tender              Adnexa: no mass, fullness, tenderness               Rectovaginal: Confirms               Anus:  normal sphincter tone, no lesions  Chaperone present: yes  A:  Well Woman with normal exam    Postmenopausal no HRT Fracture left foot and ankle - traumatic fall at work 11/14             Right hip replacement 11/11/15 and left 06/2016 History of migraine HA's better since off OCP Atrophic vaginitis  Elevated BP, history of elevated glucose   P:   Reviewed health and wellness pertinent to exam  Pap smear not done  Mammogram is due 04/2017  Update TDaP today  Follow with labs  Counseled on breast self exam, mammography screening, adequate intake of calcium and vitamin D, diet and exercise, Kegel's exercises return annually or prn  An After Visit Summary was printed and given to the patient.

## 2016-11-01 LAB — VITAMIN D 25 HYDROXY (VIT D DEFICIENCY, FRACTURES): Vit D, 25-Hydroxy: 29 ng/mL — ABNORMAL LOW (ref 30–100)

## 2016-11-03 NOTE — Progress Notes (Signed)
Encounter reviewed by Dr. Janean SarkBrook Amundson C. Washington. I recommend follow up with PCP regarding hx of elevated HgbA1C.

## 2016-11-07 ENCOUNTER — Telehealth: Payer: Self-pay | Admitting: Nurse Practitioner

## 2016-11-07 ENCOUNTER — Encounter: Payer: Self-pay | Admitting: Obstetrics and Gynecology

## 2016-11-07 NOTE — Telephone Encounter (Signed)
Pt was called to make sure that PCP Dr. Lysbeth GalasNyland is aware of her elevated HGB AIC.  He is on Epic with [facility] and she is sure he is aware.  She will bring it to his attention on their next visit.  She also wants a copy to have with her when she goes this spring. Will have Judeth CornfieldStephanie to make her a copy of recent labs as well.

## 2016-11-07 NOTE — Telephone Encounter (Signed)
Verbal Release of Medical Records completed and given to Greta to document/mail records.

## 2017-05-25 ENCOUNTER — Telehealth: Payer: Self-pay | Admitting: *Deleted

## 2017-05-25 NOTE — Telephone Encounter (Signed)
Patient notified of normal bone density results and recommended follow up.

## 2017-07-11 ENCOUNTER — Encounter: Payer: Self-pay | Admitting: Nurse Practitioner

## 2017-08-29 ENCOUNTER — Telehealth: Payer: Self-pay | Admitting: Certified Nurse Midwife

## 2017-08-29 NOTE — Telephone Encounter (Signed)
Patient is questioning when she is due for her next tdap?

## 2017-08-29 NOTE — Telephone Encounter (Signed)
Detailed message left on patient's cell number, per DPR, letting her know her last TDAP was on 10/31/16 and she will not be due for 10 years (2028). Advised to return call if she had any additional questions.   Will close encounter.

## 2017-11-01 ENCOUNTER — Other Ambulatory Visit: Payer: Self-pay

## 2017-11-01 ENCOUNTER — Encounter: Payer: Self-pay | Admitting: Certified Nurse Midwife

## 2017-11-01 ENCOUNTER — Ambulatory Visit: Payer: BC Managed Care – PPO | Admitting: Certified Nurse Midwife

## 2017-11-01 ENCOUNTER — Ambulatory Visit: Payer: BC Managed Care – PPO | Admitting: Nurse Practitioner

## 2017-11-01 VITALS — BP 118/68 | HR 80 | Resp 16 | Ht 69.0 in | Wt 172.0 lb

## 2017-11-01 DIAGNOSIS — Z862 Personal history of diseases of the blood and blood-forming organs and certain disorders involving the immune mechanism: Secondary | ICD-10-CM

## 2017-11-01 DIAGNOSIS — R899 Unspecified abnormal finding in specimens from other organs, systems and tissues: Secondary | ICD-10-CM | POA: Diagnosis not present

## 2017-11-01 DIAGNOSIS — E559 Vitamin D deficiency, unspecified: Secondary | ICD-10-CM | POA: Diagnosis not present

## 2017-11-01 DIAGNOSIS — Z0000 Encounter for general adult medical examination without abnormal findings: Secondary | ICD-10-CM | POA: Diagnosis not present

## 2017-11-01 DIAGNOSIS — Z01419 Encounter for gynecological examination (general) (routine) without abnormal findings: Secondary | ICD-10-CM | POA: Diagnosis not present

## 2017-11-01 NOTE — Patient Instructions (Signed)

## 2017-11-01 NOTE — Progress Notes (Signed)
62 y.o. G0P0000 Divorced  Caucasian Fe here for annual exam. Menopausal no HRT. Denies vaginal bleeding or vaginal dryness that she is aware of. Not sexually active. Eating well, but has gained weight and working on diet change.Amanda Washington PCP Dr. Lysbeth Galas aex and  prn. Labs today if needed. Recent trip to [place] and enjoyed the culture. Has colonoscopy in two days, now on prep given by Dr. Loreta Ave.  No other health concerns today.  Patient's last menstrual period was 05/30/2010 (exact date).          Sexually active: No.  The current method of family planning is post menopausal status.    Exercising: No.  The patient does not participate in regular exercise at present. Smoker:  no  Health Maintenance: Pap:  10-28-15 neg HPV HR neg History of Abnormal Pap: no MMG:  05-05-17 category b density birads 1:neg Self Breast exams: no Colonoscopy:  09/07/07 neg f/u 75yrs -- pt scheduled for 11/03/17 BMD:   2018  normal TDaP:  2018 Shingles: had previously in vaginal area Pneumonia: none Hep C and HIV: both 2017 Labs: discuss today   reports that  has never smoked. she has never used smokeless tobacco. She reports that she drinks about 0.6 - 1.2 oz of alcohol per week. She reports that she does not use drugs.  Past Medical History:  Diagnosis Date  . Acute blood loss anemia   . Arthritis    osteoarthritis- hip ,bilateral  . Bleeding nose    freq occ. -during childhood and related to sinus infection or dryness-less frequent now.  . Complication of anesthesia    Nausea and vomiting w Ether  . Elevated hemoglobin A1c   . Fracture of foot 08/2013   left  . GERD (gastroesophageal reflux disease)   . Hx of seasonal allergies   . IBS (irritable bowel syndrome)   . Loose stools   . Migraine headache    better since off OCP 07/2010. no longer a problem after menopause  . PONV (postoperative nausea and vomiting)   . Primary osteoarthritis of right hip   . Unsteady gait     Past Surgical History:   Procedure Laterality Date  . APPENDECTOMY     child-   . 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 TOTAL HIP ARTHROPLASTY Left 07/2016   Alusio    Current Outpatient Medications  Medication Sig Dispense Refill  . polyethylene glycol (MIRALAX / GLYCOLAX) packet Take 17 g by mouth daily as needed for mild constipation. 14 each 0   No current facility-administered medications for this visit.     Family History  Problem Relation Age of Onset  . Alzheimer's disease Mother 43  . Prostate cancer Father 70  . Hyperlipidemia Father   . Breast cancer Maternal Grandmother 90    ROS:  Pertinent items are noted in HPI.  Otherwise, a comprehensive ROS was negative.  Exam:   BP 118/68 (BP Location: Right Arm, Patient Position: Sitting, Cuff Size: Normal)   Pulse 80   Resp 16   Ht 5\' 9"  (1.753 m)   Wt 172 lb (78 kg)   LMP 05/30/2010 (Exact Date)   BMI 25.40 kg/m  Height: 5\' 9"  (175.3 cm) Ht Readings from Last 3 Encounters:  11/01/17 5\' 9"  (1.753 m)  10/31/16 5\' 9"  (1.753 m)  11/20/15 5\' 10"  (1.778 m)  General appearance: alert, cooperative and appears stated age Head: Normocephalic, without obvious abnormality, atraumatic Neck: no adenopathy, supple, symmetrical, trachea midline and thyroid normal to inspection and palpation Lungs: clear to auscultation bilaterally Breasts: normal appearance, no masses or tenderness, No nipple retraction or dimpling, No nipple discharge or bleeding, No axillary or supraclavicular adenopathy Heart: regular rate and rhythm Abdomen: soft, non-tender; no masses,  no organomegaly Extremities: extremities normal, atraumatic, no cyanosis or edema Skin: Skin color, texture, turgor normal. No rashes or lesions Lymph nodes: Cervical, supraclavicular, and axillary nodes normal. No abnormal inguinal nodes  palpated Neurologic: Grossly normal   Pelvic: External genitalia:  no lesions              Urethra:  normal appearing urethra with no masses, tenderness or lesions              Bartholin's and Skene's: normal                 Vagina: normal appearing vagina with normal color and discharge, no lesions              Cervix: no cervical motion tenderness and no lesions              Pap taken: No. Bimanual Exam:  Uterus:  normal size, contour, position, consistency, mobility, non-tender              Adnexa: normal adnexa and no mass, fullness, tenderness               Rectovaginal: Confirms               Anus:  normal sphincter tone, no lesions  Chaperone present: yes  A:  Well Woman with normal exam  Menopausal no HRT  Vitamin D deficiency and history of anemia needs labs today  PCP care for aex, not labs per patient  Colonoscopy scheduled in 2 days  Screening labs  P:   Reviewed health and wellness pertinent to exam  Aware of need to advise if vaginal bleeding or if vaginal dryness concerns  Continue follow up with MD as indicated  Keep colonoscopy appointment  Labs: Vitamin D, CMP, Lipid panel,CBC, HGb A1-C  Pap smear: no   counseled on breast self exam, mammography screening, feminine hygiene, adequate intake of calcium and vitamin D, diet and exercise   return annually or prn  An After Visit Summary was printed and given to the patient.

## 2017-11-02 LAB — VITAMIN D 25 HYDROXY (VIT D DEFICIENCY, FRACTURES): Vit D, 25-Hydroxy: 34.5 ng/mL (ref 30.0–100.0)

## 2017-11-02 LAB — CBC
Hematocrit: 41.2 % (ref 34.0–46.6)
Hemoglobin: 13.7 g/dL (ref 11.1–15.9)
MCH: 29.5 pg (ref 26.6–33.0)
MCHC: 33.3 g/dL (ref 31.5–35.7)
MCV: 89 fL (ref 79–97)
PLATELETS: 268 10*3/uL (ref 150–379)
RBC: 4.64 x10E6/uL (ref 3.77–5.28)
RDW: 13 % (ref 12.3–15.4)
WBC: 6.9 10*3/uL (ref 3.4–10.8)

## 2017-11-02 LAB — COMPREHENSIVE METABOLIC PANEL
A/G RATIO: 2 (ref 1.2–2.2)
ALK PHOS: 99 IU/L (ref 39–117)
ALT: 27 IU/L (ref 0–32)
AST: 30 IU/L (ref 0–40)
Albumin: 4.8 g/dL (ref 3.6–4.8)
BUN/Creatinine Ratio: 15 (ref 12–28)
BUN: 13 mg/dL (ref 8–27)
Bilirubin Total: 0.5 mg/dL (ref 0.0–1.2)
CHLORIDE: 99 mmol/L (ref 96–106)
CO2: 26 mmol/L (ref 20–29)
Calcium: 10 mg/dL (ref 8.7–10.3)
Creatinine, Ser: 0.88 mg/dL (ref 0.57–1.00)
GFR calc Af Amer: 83 mL/min/{1.73_m2} (ref >59)
GFR calc non Af Amer: 72 mL/min/{1.73_m2} (ref >59)
GLOBULIN, TOTAL: 2.4 g/dL (ref 1.5–4.5)
Glucose: 89 mg/dL (ref 65–99)
POTASSIUM: 4.3 mmol/L (ref 3.5–5.2)
SODIUM: 141 mmol/L (ref 134–144)
Total Protein: 7.2 g/dL (ref 6.0–8.5)

## 2017-11-02 LAB — LIPID PANEL
CHOL/HDL RATIO: 3.4 ratio (ref 0.0–4.4)
Cholesterol, Total: 209 mg/dL — ABNORMAL HIGH (ref 100–199)
HDL: 61 mg/dL (ref >39)
LDL Calculated: 132 mg/dL — ABNORMAL HIGH (ref 0–99)
Triglycerides: 79 mg/dL (ref 0–149)
VLDL CHOLESTEROL CAL: 16 mg/dL (ref 5–40)

## 2017-11-02 LAB — HEMOGLOBIN A1C
Est. average glucose Bld gHb Est-mCnc: 114 mg/dL
HEMOGLOBIN A1C: 5.6 % (ref 4.8–5.6)

## 2017-11-15 ENCOUNTER — Other Ambulatory Visit: Payer: Self-pay | Admitting: Gastroenterology

## 2017-11-15 DIAGNOSIS — Z1211 Encounter for screening for malignant neoplasm of colon: Secondary | ICD-10-CM

## 2017-11-17 ENCOUNTER — Other Ambulatory Visit: Payer: Self-pay | Admitting: Gastroenterology

## 2017-11-17 DIAGNOSIS — Z1211 Encounter for screening for malignant neoplasm of colon: Secondary | ICD-10-CM

## 2018-01-01 ENCOUNTER — Other Ambulatory Visit: Payer: BC Managed Care – PPO

## 2018-01-04 ENCOUNTER — Ambulatory Visit
Admission: RE | Admit: 2018-01-04 | Discharge: 2018-01-04 | Disposition: A | Discharge status: Home / Self Care | Payer: BC Managed Care – PPO | Source: Ambulatory Visit | Attending: Gastroenterology | Admitting: Gastroenterology

## 2018-01-04 DIAGNOSIS — Z1211 Encounter for screening for malignant neoplasm of colon: Secondary | ICD-10-CM

## 2018-01-17 IMAGING — DX DG PORTABLE PELVIS
2 series · 2 of 2 positions shown · non-contrast
Comparison: None.

Fluoroscopy: 0 minutes 9 seconds ; 0 fluoroscopic images submitted.

CLINICAL DATA: Status post right total hip replacement

EXAM:
DG C-ARM 1-60 MIN-NO REPORT; PORTABLE PELVIS 1-2 VIEWS

[pelvis ap (1 of 2)]
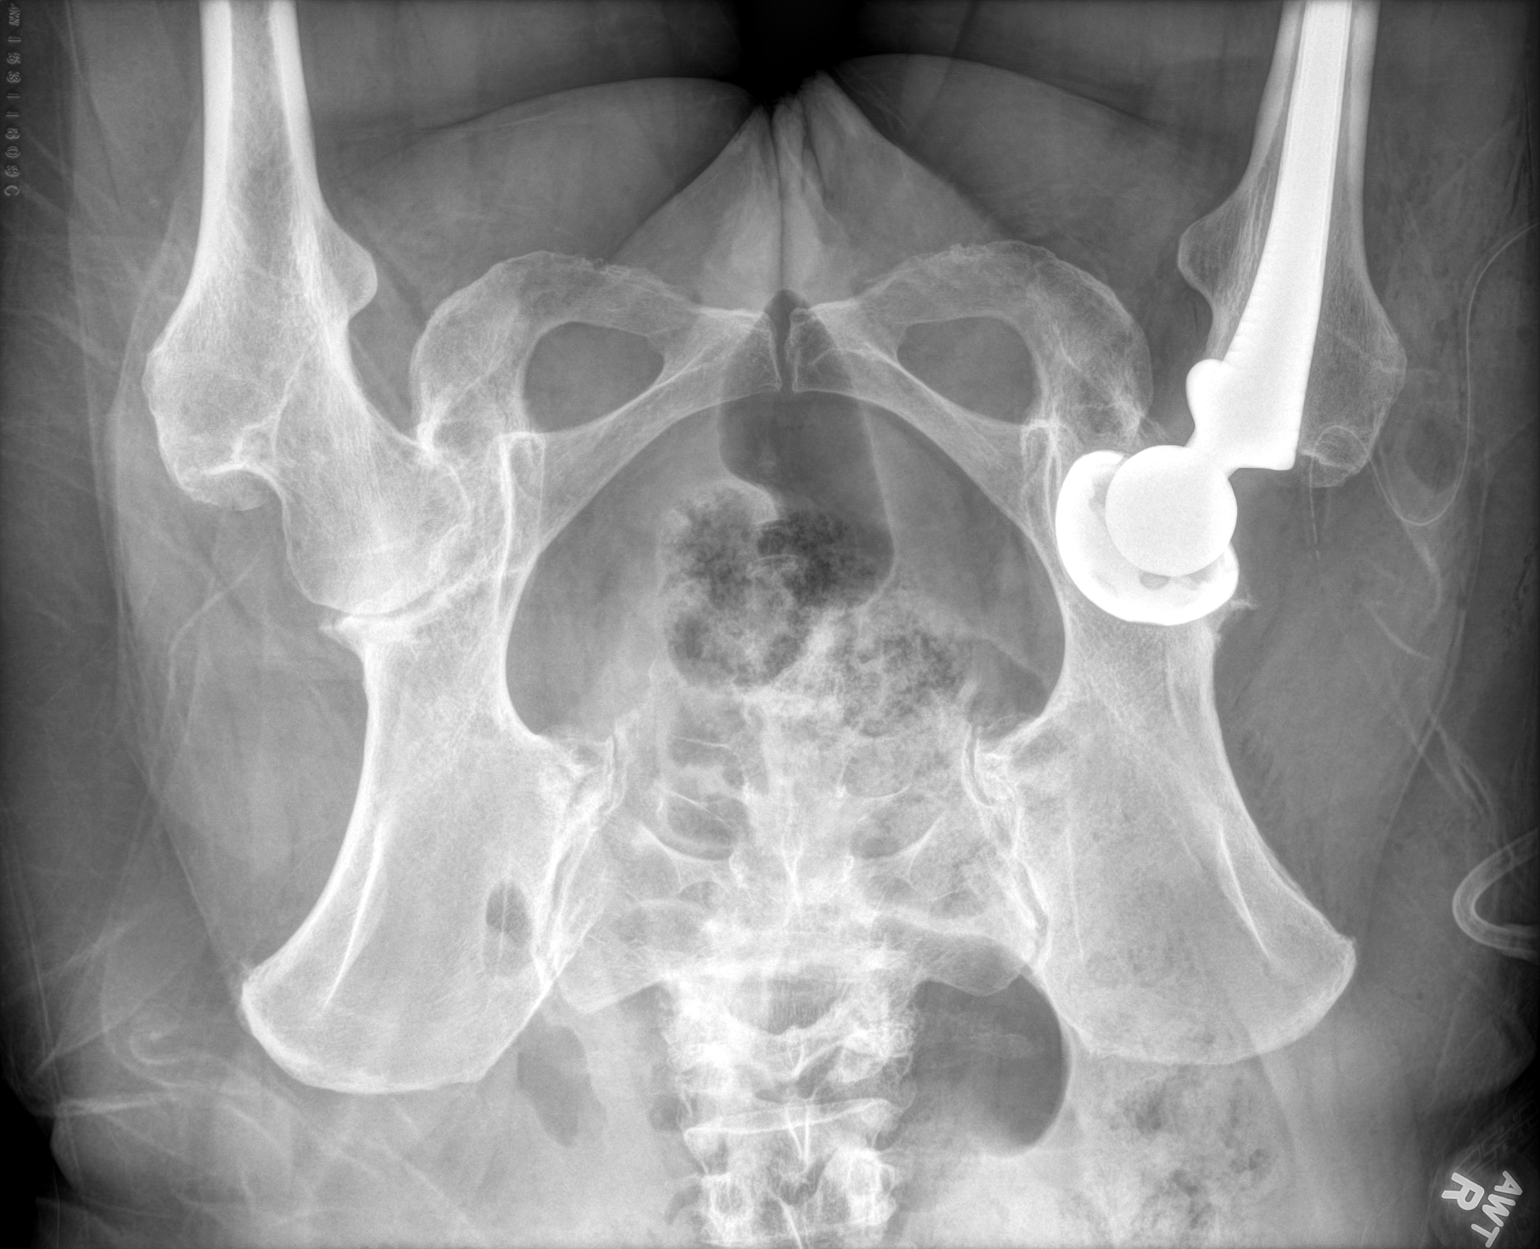

[pelvis ap (2 of 2)]
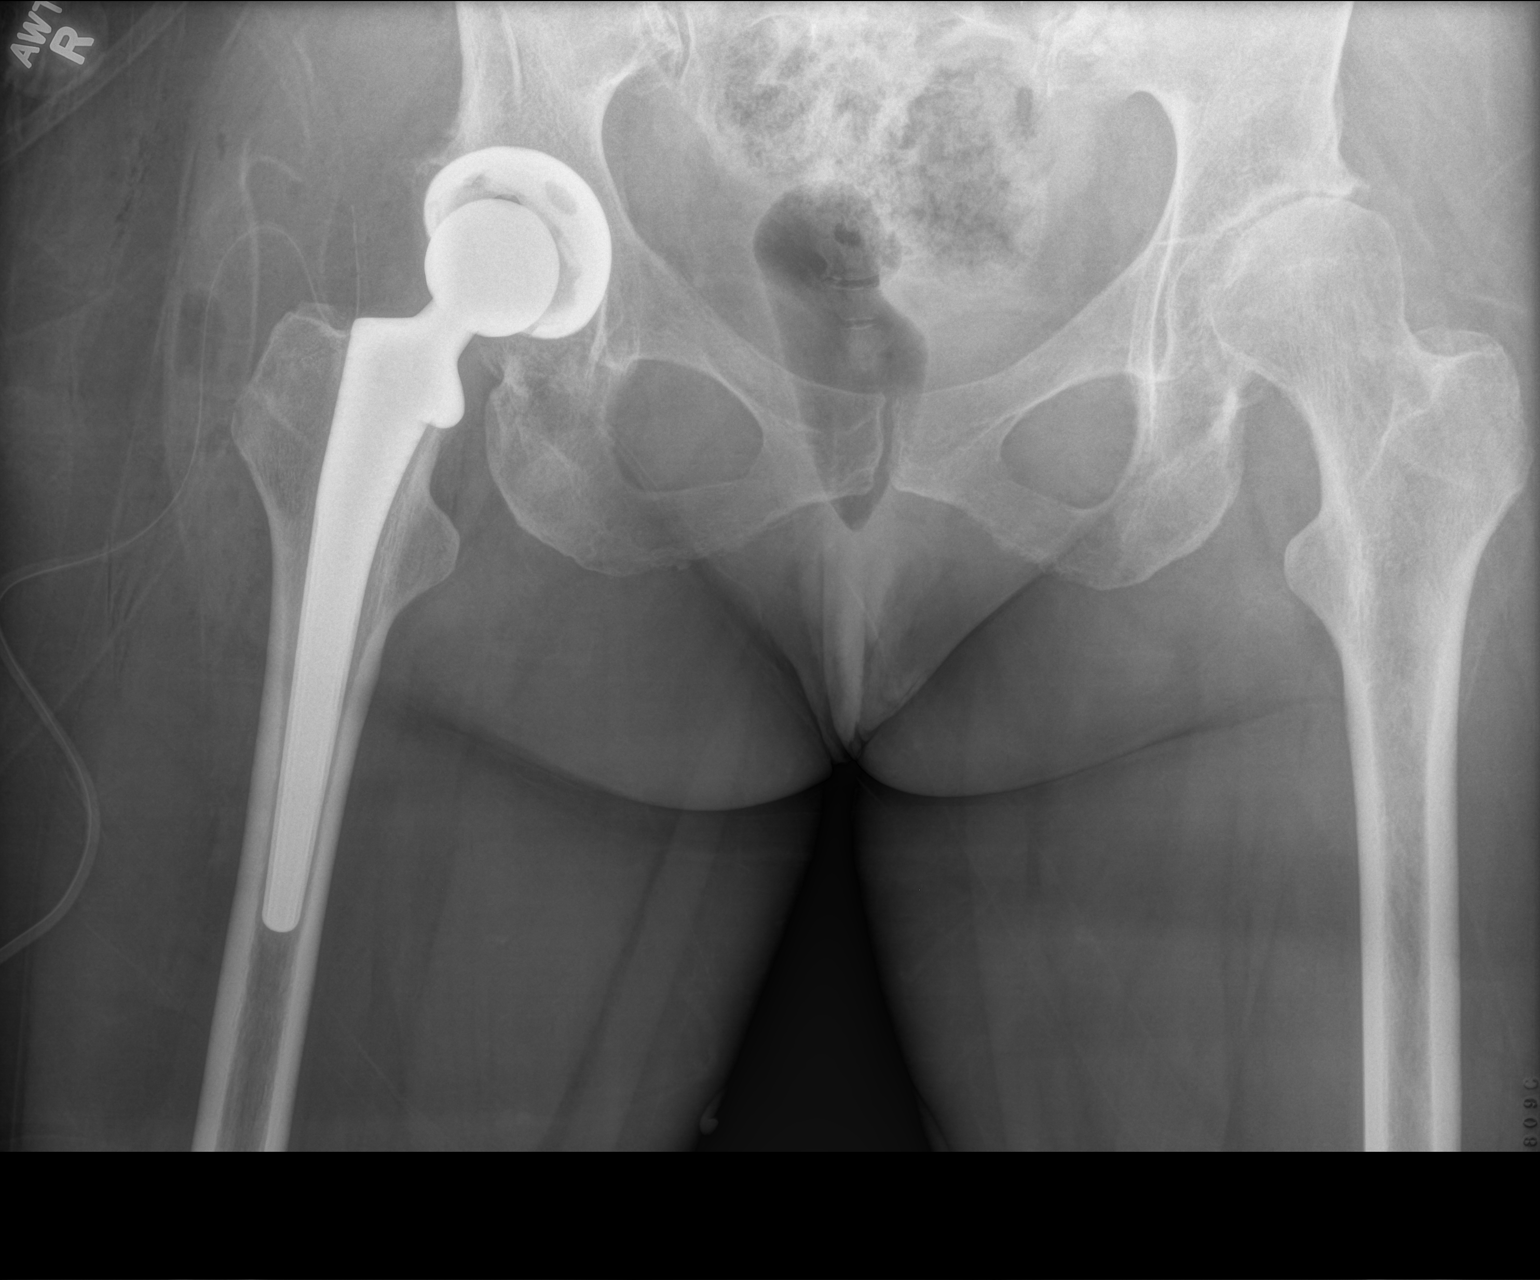

[2 of 2 positions shown; findings below may reference images not displayed]

FINDINGS: There is a total hip prosthesis on the right with the prosthetic
components appearing well-seated. There is moderate osteoarthritic
change in the left hip joint. No acute fracture or dislocation.
IMPRESSION: Right total hip prosthetic components appear well seated. No acute
fracture dislocation. Moderate osteoarthritic change left hip joint.

## 2018-05-31 ENCOUNTER — Encounter: Payer: Self-pay | Admitting: Certified Nurse Midwife

## 2018-11-15 NOTE — Progress Notes (Signed)
63 y.o. G0P0000 Divorced  Caucasian Fe here for annual exam. Post menopausal no HRT. Denies vaginal bleeding or vaginal dryness. Sees Dr. Lysbeth Galas PCP prn.. Some constipation issues and declines rectal exam. She has not been consuming her normal fluid intake and feels this has been the problem. Working on diet and fluids now, has improved. Had colonoscopy 10/18/17 all negative. No health issues today.  Patient's last menstrual period was 05/30/2010 (exact date).          Sexually active: No.  The current method of family planning is post menopausal status.    Exercising: No.  exercise Smoker:  no  Review of Systems  Constitutional: Negative.   HENT: Negative.   Eyes: Negative.   Respiratory: Negative.   Cardiovascular: Negative.   Gastrointestinal: Negative.   Genitourinary: Negative.   Musculoskeletal: Negative.   Skin: Negative.   Neurological: Negative.   Endo/Heme/Allergies: Negative.   Psychiatric/Behavioral: Negative.     Health Maintenance: Pap:  10-28-15 neg HPV HR neg History of Abnormal Pap: no MMG:  05-11-18 category c density birads 1:neg Self Breast exams: no Colonoscopy:  11-05-17 f/u 61yrs BMD:   2018 normal TDaP:  2018 Shingles: not done Pneumonia: not done Hep C and HIV: both neg 2017 Labs: yes   reports that she has never smoked. She has never used smokeless tobacco. She reports current alcohol use of about 1.0 - 2.0 standard drinks of alcohol per week. She reports that she does not use drugs.  Past Medical History:  Diagnosis Date  . Acute blood loss anemia   . Arthritis    osteoarthritis- hip ,bilateral  . Bleeding nose    freq occ. -during childhood and related to sinus infection or dryness-less frequent now.  . Complication of anesthesia    Nausea and vomiting w Ether  . Elevated hemoglobin A1c   . Fracture of foot 08/2013   left  . GERD (gastroesophageal reflux disease)   . Hx of seasonal allergies   . IBS (irritable bowel syndrome)   . Loose  stools   . Migraine headache    better since off OCP 07/2010. no longer a problem after menopause  . PONV (postoperative nausea and vomiting)   . Primary osteoarthritis of right hip   . Unsteady gait     Past Surgical History:  Procedure Laterality Date  . APPENDECTOMY     child-   . 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 TOTAL HIP ARTHROPLASTY Left 07/2016   [facility]    Current Outpatient Medications  Medication Sig Dispense Refill  . BIOTIN PO Take by mouth.    Marland Kitchen KRILL OIL PO Take by mouth.    . Multiple Vitamins-Minerals (MULTIVITAMIN PO) Take by mouth.    . polyethylene glycol (MIRALAX / GLYCOLAX) packet Take 17 g by mouth daily as needed for mild constipation. 14 each 0   No current facility-administered medications for this visit.     Family History  Problem Relation Age of Onset  . Alzheimer's disease Mother 73  . Prostate cancer Father 76  . Hyperlipidemia Father   . Breast cancer Maternal Grandmother 90    ROS:  Pertinent items are noted in HPI.  Otherwise, a comprehensive ROS was negative.  Exam:   BP 120/70   Pulse 68   Resp 16   Ht 5' 8.5" (1.74 m)  Wt 165 lb (74.8 kg)   LMP 05/30/2010 (Exact Date)   BMI 24.72 kg/m  Height: 5' 8.5" (174 cm) Ht Readings from Last 3 Encounters:  11/16/18 5' 8.5" (1.74 m)  11/01/17 5\' 9"  (1.753 m)  10/31/16 5\' 9"  (1.753 m)    General appearance: alert, cooperative and appears stated age Head: Normocephalic, without obvious abnormality, atraumatic Neck: no adenopathy, supple, symmetrical, trachea midline and thyroid normal to inspection and palpation Lungs: clear to auscultation bilaterally Breasts: normal appearance, no masses or tenderness, No nipple retraction or dimpling, No nipple discharge or bleeding, No axillary or supraclavicular adenopathy Heart: regular  rate and rhythm Abdomen: soft, non-tender; no masses,  no organomegaly Extremities: extremities normal, atraumatic, no cyanosis or edema Skin: Skin color, texture, turgor normal. No rashes or lesions Lymph nodes: Cervical, supraclavicular, and axillary nodes normal. No abnormal inguinal nodes palpated Neurologic: Grossly normal   Pelvic: External genitalia:  no lesions, normal female slight atrophic appearance              Urethra:  normal appearing urethra with no masses, tenderness or lesions              Bartholin's and Skene's: normal                 Vagina: slight atrophy noted in vagina with normal color and discharge, no lesions              Cervix: no cervical motion tenderness, no lesions and normal appearance              Pap taken: Yes.   Bimanual Exam:  Uterus:  normal size, contour, position, consistency, mobility, non-tender and anteverted              Adnexa: normal adnexa and no mass, fullness, tenderness               Rectovaginal: Confirms               Anus:  Normal appearance with hemorrhoids noted,  External, none thrombosed  Chaperone present: yes  A:  Well Woman with normal exam  Post menopausal no HRT  Vaginal dryness  History of hemorrhoids with constipation, no thrombosed hemorrhoids  Screening labs    P:   Reviewed health and wellness pertinent to exam  Discussed importance of notifying if vaginal bleeding  Discussed OTC coconut or Olive oil for dryness at bedtime 2-3 times weekly  Warning signs of hemorrhoids given  Labs: Lipid panel, CMP, Vitamin D , CBC, TSH  Pap smear: yes   counseled on breast self exam, mammography screening, feminine hygiene, adequate intake of calcium and vitamin D, diet and exercise  return annually or prn  An After Visit Summary was printed and given to the patient.

## 2018-11-16 ENCOUNTER — Other Ambulatory Visit (HOSPITAL_COMMUNITY)
Admission: RE | Admit: 2018-11-16 | Discharge: 2018-11-16 | Disposition: A | Discharge status: Home / Self Care | Payer: BC Managed Care – PPO | Source: Ambulatory Visit | Attending: Certified Nurse Midwife | Admitting: Certified Nurse Midwife

## 2018-11-16 ENCOUNTER — Encounter: Payer: Self-pay | Admitting: Certified Nurse Midwife

## 2018-11-16 ENCOUNTER — Other Ambulatory Visit: Payer: Self-pay

## 2018-11-16 ENCOUNTER — Ambulatory Visit: Payer: BC Managed Care – PPO | Admitting: Certified Nurse Midwife

## 2018-11-16 VITALS — BP 120/70 | HR 68 | Resp 16 | Ht 68.5 in | Wt 165.0 lb

## 2018-11-16 DIAGNOSIS — Z0000 Encounter for general adult medical examination without abnormal findings: Secondary | ICD-10-CM

## 2018-11-16 DIAGNOSIS — Z124 Encounter for screening for malignant neoplasm of cervix: Secondary | ICD-10-CM | POA: Insufficient documentation

## 2018-11-16 DIAGNOSIS — E559 Vitamin D deficiency, unspecified: Secondary | ICD-10-CM

## 2018-11-16 DIAGNOSIS — Z01419 Encounter for gynecological examination (general) (routine) without abnormal findings: Secondary | ICD-10-CM | POA: Diagnosis not present

## 2018-11-16 DIAGNOSIS — R899 Unspecified abnormal finding in specimens from other organs, systems and tissues: Secondary | ICD-10-CM | POA: Diagnosis not present

## 2018-11-16 DIAGNOSIS — N951 Menopausal and female climacteric states: Secondary | ICD-10-CM

## 2018-11-16 NOTE — Patient Instructions (Signed)

## 2018-11-17 LAB — CBC
Hematocrit: 37.7 % (ref 34.0–46.6)
Hemoglobin: 12.9 g/dL (ref 11.1–15.9)
MCH: 30.5 pg (ref 26.6–33.0)
MCHC: 34.2 g/dL (ref 31.5–35.7)
MCV: 89 fL (ref 79–97)
Platelets: 276 x10E3/uL (ref 150–450)
RBC: 4.23 x10E6/uL (ref 3.77–5.28)
RDW: 12.3 % (ref 11.7–15.4)
WBC: 4.5 x10E3/uL (ref 3.4–10.8)

## 2018-11-17 LAB — COMPREHENSIVE METABOLIC PANEL WITH GFR
ALT: 13 IU/L (ref 0–32)
AST: 19 IU/L (ref 0–40)
Albumin/Globulin Ratio: 2.3 — ABNORMAL HIGH (ref 1.2–2.2)
Albumin: 4.5 g/dL (ref 3.8–4.8)
Alkaline Phosphatase: 73 IU/L (ref 39–117)
BUN/Creatinine Ratio: 17 (ref 12–28)
BUN: 14 mg/dL (ref 8–27)
Bilirubin Total: 0.5 mg/dL (ref 0.0–1.2)
CO2: 25 mmol/L (ref 20–29)
Calcium: 9.1 mg/dL (ref 8.7–10.3)
Chloride: 102 mmol/L (ref 96–106)
Creatinine, Ser: 0.83 mg/dL (ref 0.57–1.00)
GFR calc Af Amer: 88 mL/min/1.73 (ref >59)
GFR calc non Af Amer: 76 mL/min/1.73 (ref >59)
Globulin, Total: 2 g/dL (ref 1.5–4.5)
Glucose: 94 mg/dL (ref 65–99)
Potassium: 4.3 mmol/L (ref 3.5–5.2)
Sodium: 142 mmol/L (ref 134–144)
Total Protein: 6.5 g/dL (ref 6.0–8.5)

## 2018-11-17 LAB — LIPID PANEL
Chol/HDL Ratio: 2.4 ratio (ref 0.0–4.4)
Cholesterol, Total: 173 mg/dL (ref 100–199)
HDL: 71 mg/dL (ref >39)
LDL Calculated: 95 mg/dL (ref 0–99)
Triglycerides: 36 mg/dL (ref 0–149)
VLDL Cholesterol Cal: 7 mg/dL (ref 5–40)

## 2018-11-17 LAB — VITAMIN D 25 HYDROXY (VIT D DEFICIENCY, FRACTURES): VIT D 25 HYDROXY: 28.7 ng/mL — AB (ref 30.0–100.0)

## 2018-11-17 LAB — TSH: TSH: 0.927 u[IU]/mL (ref 0.450–4.500)

## 2018-11-19 LAB — CYTOLOGY - PAP
DIAGNOSIS: NEGATIVE
HPV: NOT DETECTED

## 2019-05-24 ENCOUNTER — Encounter: Payer: Self-pay | Admitting: Certified Nurse Midwife

## 2019-06-20 DIAGNOSIS — Z96643 Presence of artificial hip joint, bilateral: Secondary | ICD-10-CM | POA: Insufficient documentation

## 2019-11-29 ENCOUNTER — Ambulatory Visit: Payer: BC Managed Care – PPO | Admitting: Certified Nurse Midwife

## 2019-12-19 ENCOUNTER — Other Ambulatory Visit: Payer: Self-pay

## 2019-12-20 ENCOUNTER — Encounter: Payer: Self-pay | Admitting: Certified Nurse Midwife

## 2019-12-20 ENCOUNTER — Ambulatory Visit: Payer: BC Managed Care – PPO | Admitting: Certified Nurse Midwife

## 2019-12-20 ENCOUNTER — Other Ambulatory Visit: Payer: Self-pay

## 2019-12-20 VITALS — BP 120/72 | HR 68 | Temp 98.2°F | Resp 16 | Ht 68.5 in | Wt 165.0 lb

## 2019-12-20 DIAGNOSIS — Z0000 Encounter for general adult medical examination without abnormal findings: Secondary | ICD-10-CM

## 2019-12-20 DIAGNOSIS — Z01419 Encounter for gynecological examination (general) (routine) without abnormal findings: Secondary | ICD-10-CM | POA: Diagnosis not present

## 2019-12-20 DIAGNOSIS — N952 Postmenopausal atrophic vaginitis: Secondary | ICD-10-CM | POA: Diagnosis not present

## 2019-12-20 DIAGNOSIS — E559 Vitamin D deficiency, unspecified: Secondary | ICD-10-CM | POA: Diagnosis not present

## 2019-12-20 NOTE — Patient Instructions (Signed)
EXERCISE AND DIET:  We recommended that you start or continue a regular exercise program for good health. Regular exercise means any activity that makes your heart beat faster and makes you sweat.  We recommend exercising at least 30 minutes per day at least 3 days a week, preferably 4 or 5.  We also recommend a diet low in fat and sugar.  Inactivity, poor dietary choices and obesity can cause diabetes, heart attack, stroke, and kidney damage, among others.   ° °ALCOHOL AND SMOKING:  Women should limit their alcohol intake to no more than 7 drinks/beers/glasses of wine (combined, not each!) per week. Moderation of alcohol intake to this level decreases your risk of breast cancer and liver damage. And of course, no recreational drugs are part of a healthy lifestyle.  And absolutely no smoking or even second hand smoke. Most people know smoking can cause heart and lung diseases, but did you know it also contributes to weakening of your bones? Aging of your skin?  Yellowing of your teeth and nails? ° °CALCIUM AND VITAMIN D:  Adequate intake of calcium and Vitamin D are recommended.  The recommendations for exact amounts of these supplements seem to change often, but generally speaking 600 mg of calcium (either carbonate or citrate) and 800 units of Vitamin D per day seems prudent. Certain women may benefit from higher intake of Vitamin D.  If you are among these women, your doctor will have told you during your visit.   ° °PAP SMEARS:  Pap smears, to check for cervical cancer or precancers,  have traditionally been done yearly, although recent scientific advances have shown that most women can have pap smears less often.  However, every woman still should have a physical exam from her gynecologist every year. It will include a breast check, inspection of the vulva and vagina to check for abnormal growths or skin changes, a visual exam of the cervix, and then an exam to evaluate the size and shape of the uterus and  ovaries.  And after 63 years of age, a rectal exam is indicated to check for rectal cancers. We will also provide age appropriate advice regarding health maintenance, like when you should have certain vaccines, screening for sexually transmitted diseases, bone density testing, colonoscopy, mammograms, etc.  ° °MAMMOGRAMS:  All women over 40 years old should have a yearly mammogram. Many facilities now offer a "3D" mammogram, which may cost around $50 extra out of pocket. If possible,  we recommend you accept the option to have the 3D mammogram performed.  It both reduces the number of women who will be called back for extra views which then turn out to be normal, and it is better than the routine mammogram at detecting truly abnormal areas.   ° °COLONOSCOPY:  Colonoscopy to screen for colon cancer is recommended for all women at age 50.  We know, you hate the idea of the prep.  We agree, BUT, having colon cancer and not knowing it is worse!!  Colon cancer so often starts as a polyp that can be seen and removed at colonscopy, which can quite literally save your life!  And if your first colonoscopy is normal and you have no family history of colon cancer, most women don't have to have it again for 10 years.  Once every ten years, you can do something that may end up saving your life, right?  We will be happy to help you get it scheduled when you are ready.    Be sure to check your insurance coverage so you understand how much it will cost.  It may be covered as a preventative service at no cost, but you should check your particular policy.   ° ° ° °Atrophic Vaginitis °Atrophic vaginitis is a condition in which the tissues that line the vagina become dry and thin. This condition occurs in women who have stopped having their period. It is caused by a drop in a female hormone (estrogen). This hormone helps: °· To keep the vagina moist. °· To make a clear fluid. This clear fluid helps: °? To make the vagina ready for  sex. °? To protect the vagina from infection. °If the lining of the vagina is dry and thin, it may cause irritation, burning, or itchiness. It may also: °· Make sex painful. °· Make an exam of your vagina painful. °· Cause bleeding. °· Make you lose interest in sex. °· Cause a burning feeling when you pee (urinate). °· Cause a brown or yellow fluid to come from your vagina. °Some women do not have symptoms. °Follow these instructions at home: °Medicines °· Take over-the-counter and prescription medicines only as told by your doctor. °· Do not use herbs or other medicines unless your doctor says it is okay. °· Use medicines for for dryness. These include: °? Oils to make the vagina soft. °? Creams. °? Moisturizers. °General instructions °· Do not douche. °· Do not use products that can make your vagina dry. These include: °? Scented sprays. °? Scented tampons. °? Scented soaps. °· Sex can help increase blood flow and soften the tissue in the vagina. If it hurts to have sex: °? Tell your partner. °? Use products to make sex more comfortable. Use these only as told by your doctor. °Contact a doctor if you: °· Have discharge from the vagina that is different than usual. °· Have a bad smell coming from your vagina. °· Have new symptoms. °· Do not get better. °· Get worse. °Summary °· Atrophic vaginitis is a condition in which the lining of the vagina becomes dry and thin. °· This condition affects women who have stopped having their periods. °· Treatment may include using products that help make the vagina soft. °· Call a doctor if do not get better with treatment. °This information is not intended to replace advice given to you by your health care provider. Make sure you discuss any questions you have with your health care provider. °Document Revised: 10/16/2017 Document Reviewed: 10/16/2017 °Elsevier Patient Education © 2020 [employer] Inc[employer] ° °

## 2019-12-20 NOTE — Progress Notes (Signed)
64 y.o. G0P0000 Divorced  Caucasian Fe here for annual exam. Menopausal denies vaginal bleeding or vaginal dryness. Father has been ill and  now out of ICU, stable now. Patient trying to eat healthy, no weight gain. Working throughout [employer]. Has had one Covid vaccine and plans for second one. Sees PCP for aex and labs, but has retired, request labs today. Plans to see someone else in the practice. Needs to visit father soon. No other health issues today.     Patient's last menstrual period was 05/30/2010 (exact date).          Sexually active: No.  The current method of family planning is post menopausal status.    Exercising: No.  exercise Smoker:  no  Review of Systems  Constitutional: Negative.   HENT: Negative.   Eyes: Negative.   Respiratory: Negative.   Cardiovascular: Negative.   Gastrointestinal: Negative.   Genitourinary: Negative.   Musculoskeletal: Negative.   Skin: Negative.   Neurological: Negative.   Endo/Heme/Allergies: Negative.   Psychiatric/Behavioral: Negative.     Health Maintenance: Pap:  10-28-15 neg HPV HR neg, 11-16-2018 neg HPV HR neg History of Abnormal Pap: no MMG:  05-24-2019 category c density birads 1:neg Self Breast exams: no Colonoscopy:  11-05-17 neg f/u 64yrs BMD:   2018 normal TDaP:  2018 Shingles: not done Pneumonia: not done Hep C and HIV: both neg 2017 Labs: if needed   reports that she has never smoked. She has never used smokeless tobacco. She reports current alcohol use of about 1.0 - 2.0 standard drinks of alcohol per week. She reports that she does not use drugs.  Past Medical History:  Diagnosis Date  . Acute blood loss anemia   . Arthritis    osteoarthritis- hip ,bilateral  . Bleeding nose    freq occ. -during childhood and related to sinus infection or dryness-less frequent now.  . Complication of anesthesia    Nausea and vomiting w Ether  . Elevated hemoglobin A1c   . Fracture of foot 08/2013   left  . GERD (gastroesophageal  reflux disease)   . Hx of seasonal allergies   . IBS (irritable bowel syndrome)   . Loose stools   . Migraine headache    better since off OCP 07/2010. no longer a problem after menopause  . PONV (postoperative nausea and vomiting)   . Primary osteoarthritis of right hip   . Shingles   . Unsteady gait     Past Surgical History:  Procedure Laterality Date  . APPENDECTOMY     child-   . 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 TOTAL HIP ARTHROPLASTY Left 07/2016   [facility]    Current Outpatient Medications  Medication Sig Dispense Refill  . BIOTIN PO Take by mouth.    Marland Kitchen KRILL OIL PO Take by mouth.    . Multiple Vitamins-Minerals (MULTIVITAMIN PO) Take by mouth.    . polyethylene glycol (MIRALAX / GLYCOLAX) packet Take 17 g by mouth daily as needed for mild constipation. 14 each 0   No current facility-administered medications for this visit.    Family History  Problem Relation Age of Onset  . Alzheimer's disease Mother 80  . Prostate cancer Father 44  . Hyperlipidemia Father   . Breast cancer Maternal Grandmother 90    ROS:  Pertinent items are noted in HPI.  Otherwise, a comprehensive ROS was negative.  Exam:   Temp 98.2 F (36.8 C) (Skin)   Ht 5' 8.5" (1.74 m)   Wt 165 lb (74.8 kg)   LMP 05/30/2010 (Exact Date)   BMI 24.72 kg/m  Height: 5' 8.5" (174 cm) Ht Readings from Last 3 Encounters:  12/20/19 5' 8.5" (1.74 m)  11/16/18 5' 8.5" (1.74 m)  11/01/17 5\' 9"  (1.753 m)    General appearance: alert, cooperative and appears stated age Head: Normocephalic, without obvious abnormality, atraumatic Neck: no adenopathy, supple, symmetrical, trachea midline and thyroid normal to inspection and palpation Lungs: clear to auscultation bilaterally Breasts: normal appearance, no masses or tenderness, No nipple retraction or  dimpling, No nipple discharge or bleeding, No axillary or supraclavicular adenopathy Heart: regular rate and rhythm Abdomen: soft, non-tender; no masses,  no organomegaly Extremities: extremities normal, atraumatic, no cyanosis or edema Skin: Skin color, texture, turgor normal. No rashes or lesions Lymph nodes: Cervical, supraclavicular, and axillary nodes normal. No abnormal inguinal nodes palpated Neurologic: Grossly normal   Pelvic: External genitalia:  no lesions              Urethra:  normal appearing urethra with no masses, tenderness or lesions              Bartholin's and Skene's: normal                 Vagina: normal appearing vagina with normal color and discharge, no lesions              Cervix: no cervical motion tenderness, no lesions and normal appearance              Pap taken: No. Bimanual Exam:  Uterus:  normal size, contour, position, consistency, mobility, non-tender and anteverted              Adnexa: normal adnexa and no mass, fullness, tenderness               Rectovaginal: Confirms               Anus:  normal sphincter tone, no lesions  Chaperone present: yes  A:  Well Woman with normal exam  Post menopausal no HRT  Vaginal dryness  Social stress with father's illness  Screening labs    P:   Reviewed health and wellness pertinent to exam  Aware of need to advise if vaginal bleeding or dryness changes  Continue with coconut or Olive oil use for dryness and decrease in risk of UTI  Encouraged to take time with father as indicated  Labs:CBC,CMP, Lipid panel, TSH,Vitamin D  Pap smear: no   counseled on breast self exam, mammography screening, feminine hygiene, menopause, osteoporosis, adequate intake of calcium and vitamin D, diet and exercise  return annually or prn  An After Visit Summary was printed and given to the patient.

## 2019-12-21 LAB — CBC
Hematocrit: 37.8 % (ref 34.0–46.6)
Hemoglobin: 13 g/dL (ref 11.1–15.9)
MCH: 31.7 pg (ref 26.6–33.0)
MCHC: 34.4 g/dL (ref 31.5–35.7)
MCV: 92 fL (ref 79–97)
Platelets: 279 10*3/uL (ref 150–450)
RBC: 4.1 x10E6/uL (ref 3.77–5.28)
RDW: 12.2 % (ref 11.7–15.4)
WBC: 5.9 10*3/uL (ref 3.4–10.8)

## 2019-12-21 LAB — COMPREHENSIVE METABOLIC PANEL
ALT: 16 IU/L (ref 0–32)
AST: 23 IU/L (ref 0–40)
Albumin/Globulin Ratio: 2.5 — ABNORMAL HIGH (ref 1.2–2.2)
Albumin: 4.8 g/dL (ref 3.8–4.8)
Alkaline Phosphatase: 79 IU/L (ref 39–117)
BUN/Creatinine Ratio: 15 (ref 12–28)
BUN: 13 mg/dL (ref 8–27)
Bilirubin Total: 0.5 mg/dL (ref 0.0–1.2)
CO2: 26 mmol/L (ref 20–29)
Calcium: 9.3 mg/dL (ref 8.7–10.3)
Chloride: 102 mmol/L (ref 96–106)
Creatinine, Ser: 0.89 mg/dL (ref 0.57–1.00)
GFR calc Af Amer: 80 mL/min/{1.73_m2} (ref >59)
GFR calc non Af Amer: 70 mL/min/{1.73_m2} (ref >59)
Globulin, Total: 1.9 g/dL (ref 1.5–4.5)
Glucose: 88 mg/dL (ref 65–99)
Potassium: 4.2 mmol/L (ref 3.5–5.2)
Sodium: 141 mmol/L (ref 134–144)
Total Protein: 6.7 g/dL (ref 6.0–8.5)

## 2019-12-21 LAB — LIPID PANEL
Chol/HDL Ratio: 2.8 ratio (ref 0.0–4.4)
Cholesterol, Total: 195 mg/dL (ref 100–199)
HDL: 69 mg/dL (ref >39)
LDL Chol Calc (NIH): 116 mg/dL — ABNORMAL HIGH (ref 0–99)
Triglycerides: 53 mg/dL (ref 0–149)
VLDL Cholesterol Cal: 10 mg/dL (ref 5–40)

## 2019-12-21 LAB — VITAMIN D 25 HYDROXY (VIT D DEFICIENCY, FRACTURES): Vit D, 25-Hydroxy: 40.3 ng/mL (ref 30.0–100.0)

## 2019-12-21 LAB — TSH: TSH: 1.44 u[IU]/mL (ref 0.450–4.500)

## 2020-01-08 ENCOUNTER — Encounter: Payer: Self-pay | Admitting: Certified Nurse Midwife

## 2020-01-22 ENCOUNTER — Ambulatory Visit: Payer: BC Managed Care – PPO | Attending: Internal Medicine

## 2020-01-22 DIAGNOSIS — Z23 Encounter for immunization: Secondary | ICD-10-CM

## 2020-01-22 NOTE — Progress Notes (Signed)
   Covid-19 Vaccination Clinic  Name:  Amanda Washington    MRN: [id number] DOB: [birth year]  01/22/2020  Amanda Washington was observed post Covid-19 immunization for 15 minutes without incident. She was provided with Vaccine Information Sheet and instruction to access the V-Safe system.   Amanda Washington was instructed to call 911 with any severe reactions post vaccine: Marland Kitchen Difficulty breathing  . Swelling of face and throat  . A fast heartbeat  . A bad rash all over body  . Dizziness and weakness   Immunizations Administered    Name Date Dose VIS Date Route   Moderna COVID-19 Vaccine 01/22/2020 10:15 AM 0.5 mL 09/17/2019 Intramuscular   Manufacturer: Gala Murdoch   Lot: [id number]   NDC: 40370-964-38

## 2020-03-12 IMAGING — CT CT VIRTUAL COLONOSCOPY DIAGNOSTIC
3 of 15 series · 10 of 46 positions shown, 15 images · non-contrast
Comparison: None.

CLINICAL DATA: Tortuous colon.  Incomplete optical colonoscopy.

EXAM:
CT VIRTUAL COLONOSCOPY DIAGNOSTIC
TECHNIQUE: The patient was given a standard bowel preparation with Gastrografin
and barium for fluid and stool tagging respectively. The quality of
the bowel preparation is moderate. Automated CO2 insufflation of the
colon was performed prior to image acquisition and colonic
distention is moderate. Image post processing was used to generate a
3D endoluminal fly-through projection of the colon and to
electronically subtract stool/fluid as appropriate.

[Series 3: supine colon 1.50 br40 s3 cor · coronal · 0.80mm/px · 3 of 311 slices shown, 4 images]
[im 78/311  soft-tissue]
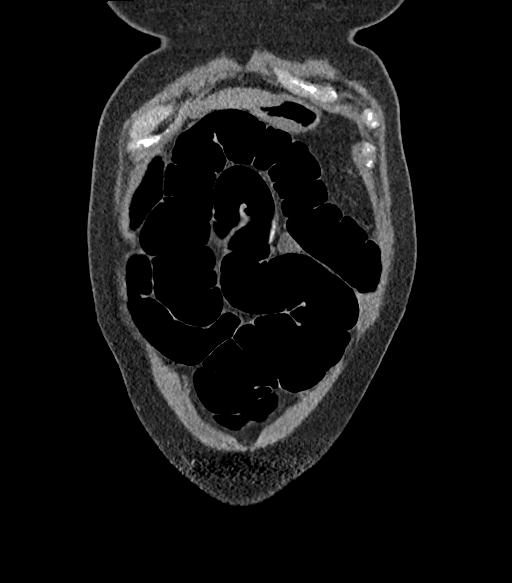
[im 156/311  soft-tissue]
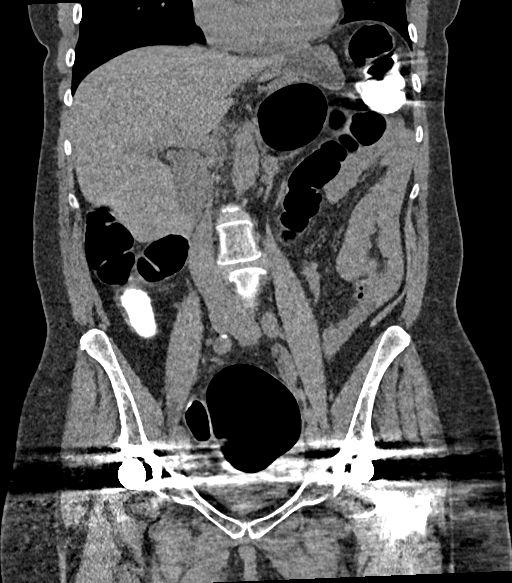
[im 156/311  bone]
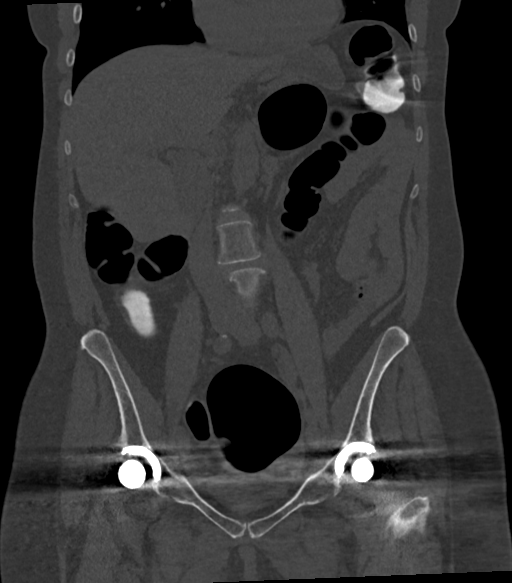
[im 233/311  soft-tissue]
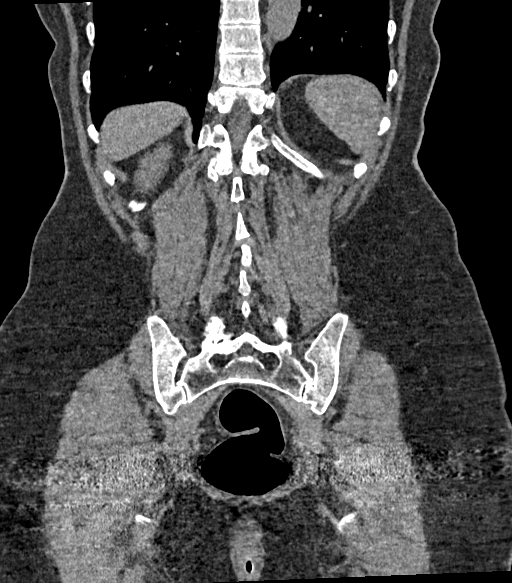

[Series 13: prone colon 1.50 br40 s3 ax · axial · 0.58mm/px · z∈[+1394,+1647]mm · 3 of 507 slices shown, 7 images]
[im 127/507  soft-tissue]
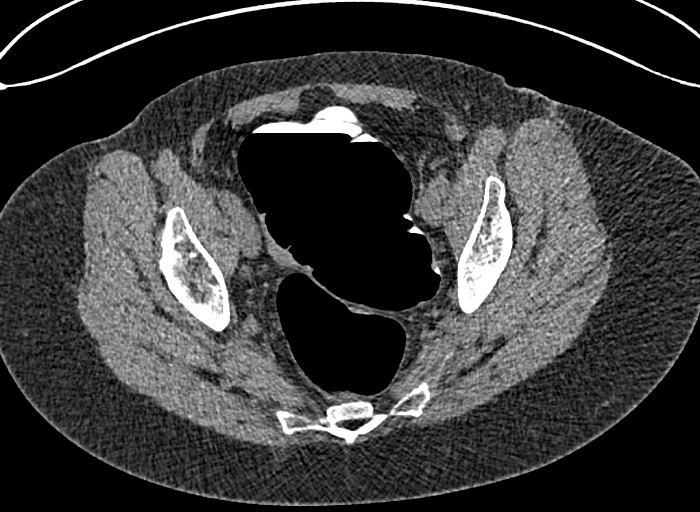
[im 127/507  lung]
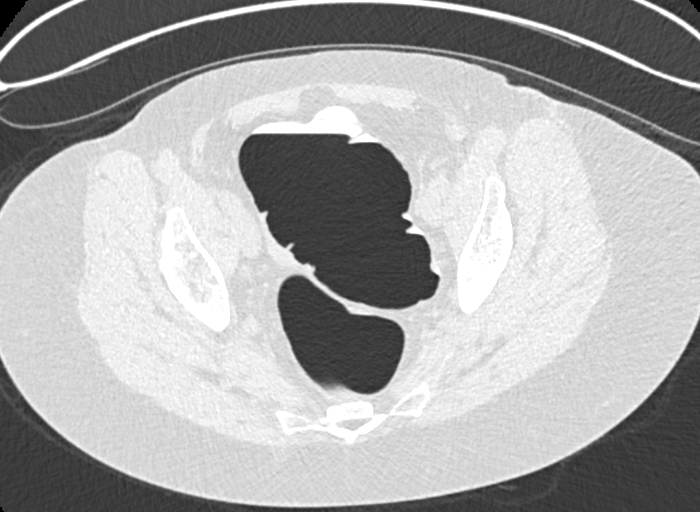
[im 127/507  bone]
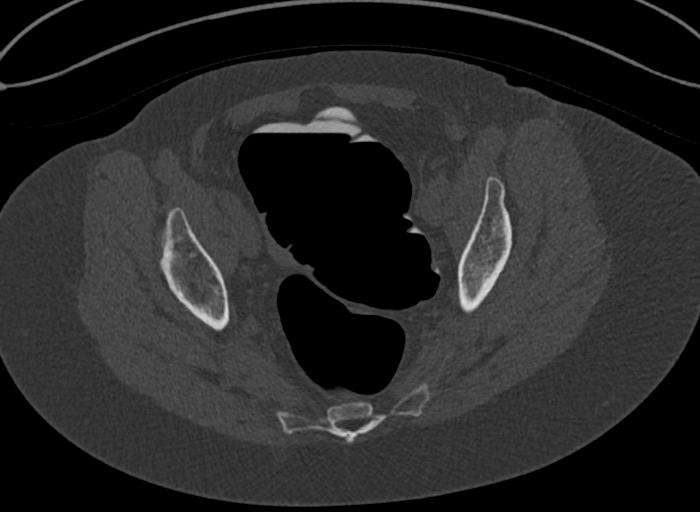
[im 254/507  soft-tissue]
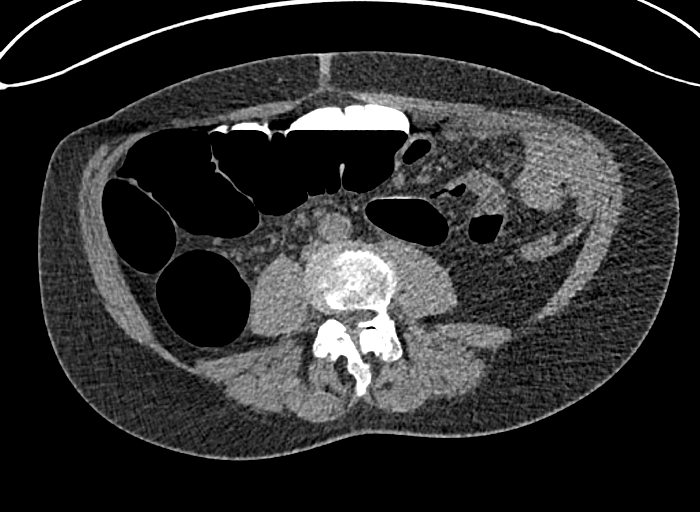
[im 254/507  lung]
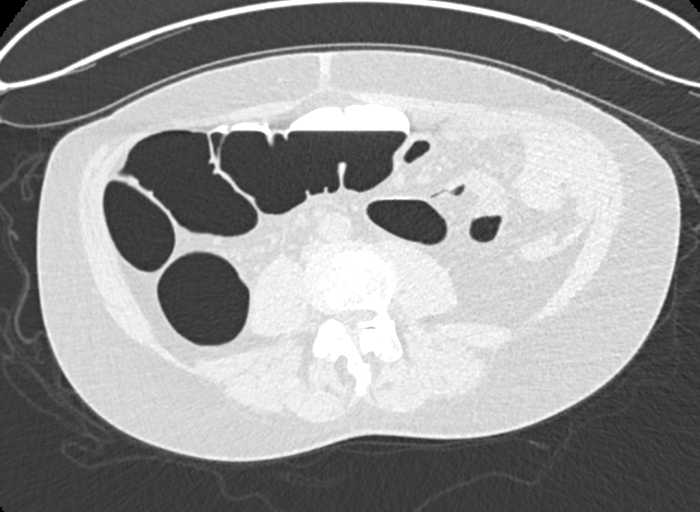
[im 380/507  soft-tissue]
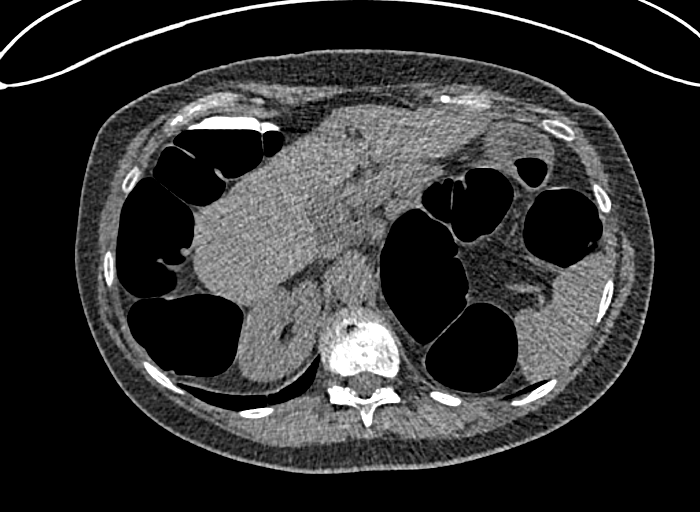
[im 380/507  lung]
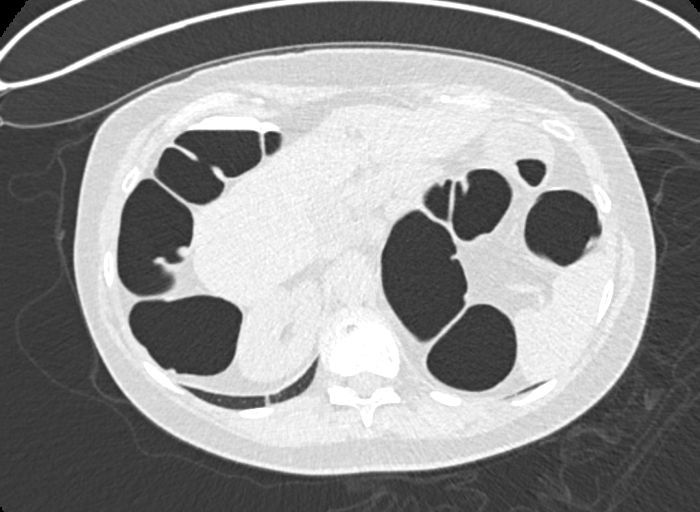

[Series 21: supine colon 1.50 br40 s3 ax · axial · 0.61mm/px · z∈[+1325,+1604]mm · 4 of 467 slices shown]
[im 94/467  soft-tissue]
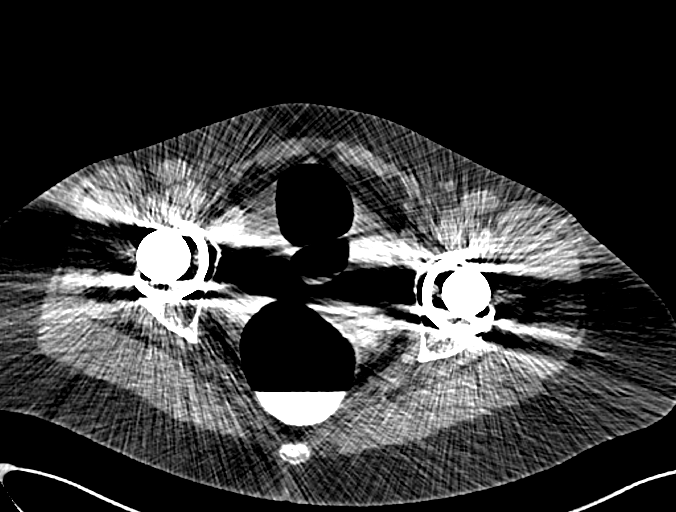
[im 187/467  soft-tissue]
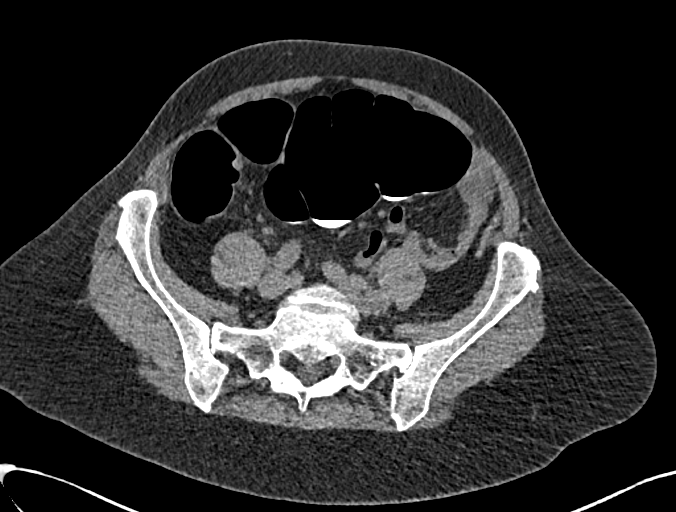
[im 280/467  soft-tissue]
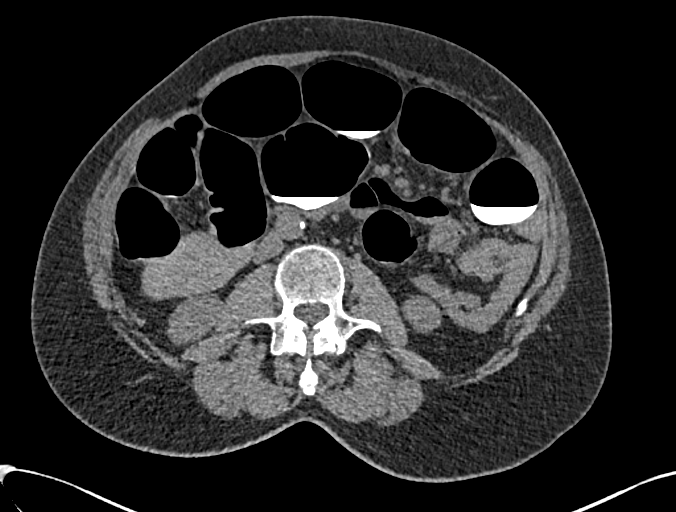
[im 373/467  soft-tissue]
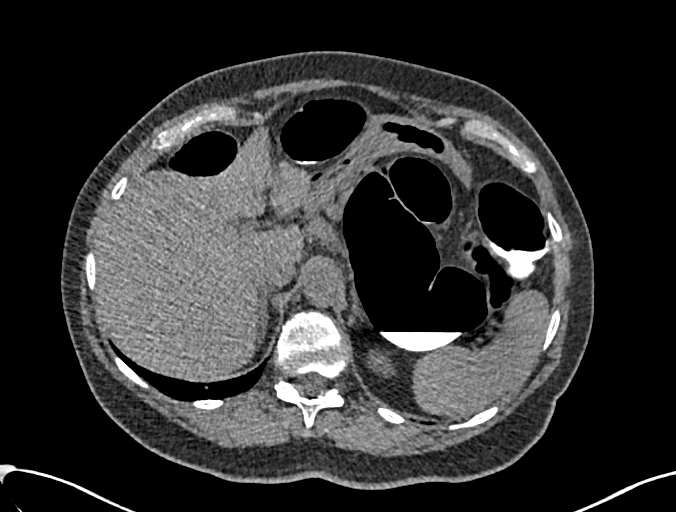

[10 of 46 positions shown; findings below may reference images not displayed]

FINDINGS: VIRTUAL COLONOSCOPY

Moderate retained layering barium in the colon. Markedly tortuous
sigmoid colon and transverse colon. No persistent polypoid filling
defects or annular constricting lesions. Few scattered sigmoid
diverticula.

Virtual colonoscopy is not designed to detect diminutive polyps
(i.e., less than or equal to 5 mm), the presence or absence of which
may not affect clinical management.

CT ABDOMEN AND PELVIS WITHOUT CONTRAST

Lower chest: Lung bases are clear. No effusions. Heart is normal
size.

Hepatobiliary: No focal hepatic abnormality. Gallbladder
unremarkable.

Pancreas: No focal abnormality or ductal dilatation.

Spleen: No focal abnormality.  Normal size.

Adrenals/Urinary Tract: No adrenal abnormality. No focal renal
abnormality. No stones or hydronephrosis. Urinary bladder is
unremarkable.

Stomach/Bowel: Stomach and small bowel grossly unremarkable,
decompressed.

Vascular/Lymphatic: Aortic atherosclerosis. No enlarged abdominal or
pelvic lymph nodes.

Reproductive: Beam hardening artifact from bilateral hip
replacements obscures lower pelvic structures. No visible
adnexal/pelvic mass.

Other: No free fluid or free air.

Musculoskeletal: Bilateral hip replacements. Degenerative changes in
the lower lumbar spine.
IMPRESSION: Markedly tortuous colon. No visible persistent polypoid filling
defects or annular constricting lesions.

Aortic atherosclerosis.

No acute extra colonic abnormality.

## 2020-05-28 ENCOUNTER — Telehealth: Payer: Self-pay

## 2020-05-28 NOTE — Telephone Encounter (Signed)
AEX 12/20/19 with DL Last BMD 0/0174- normal/negative at [facility] scanned report  Last MMG 05/2019, next scheduled for 05/29/2020  Spoke with pt. Pt states needing order for BMD at [facility]. Pt advised will review with Dr Edward Jolly and fax order to [facility] today. Pt agreeable and verbalized understanding.   Orders filled out and placed on Dr Rica Records desk for review and signature.   Routing to Dr Edward Jolly

## 2020-05-28 NOTE — Telephone Encounter (Signed)
Patient is calling in regards to bone density test.

## 2020-05-29 NOTE — Telephone Encounter (Signed)
I just reviewed patient's last BMD, which showed normal bone density.  I would recommend waiting to do her next BMD when she is 64 yo.

## 2020-05-29 NOTE — Telephone Encounter (Signed)
Spoke with pt. Pt given update and recommendations per Dr Edward Jolly. Pt agreeable and verbalized understanding. Pt will have BMD in 2 years.  Encounter closed.

## 2020-06-23 ENCOUNTER — Encounter: Payer: Self-pay | Admitting: Obstetrics and Gynecology

## 2020-12-22 ENCOUNTER — Other Ambulatory Visit: Payer: Self-pay

## 2020-12-22 ENCOUNTER — Encounter: Payer: Self-pay | Admitting: Obstetrics and Gynecology

## 2020-12-22 ENCOUNTER — Ambulatory Visit: Payer: BC Managed Care – PPO | Admitting: Obstetrics and Gynecology

## 2020-12-22 VITALS — BP 150/98 | HR 78 | Resp 14 | Ht 69.0 in | Wt 170.0 lb

## 2020-12-22 DIAGNOSIS — Z01419 Encounter for gynecological examination (general) (routine) without abnormal findings: Secondary | ICD-10-CM | POA: Diagnosis not present

## 2020-12-22 NOTE — Progress Notes (Signed)
65 y.o. G0P0000 Divorced Caucasian female here for annual exam.    Doing well since she had her hip replacements.   Participated in a study at [facility] about dementia.  FH of Alzheimer's disease.  Patient has some questions about her cognitive function.   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 Likes to spend time outside.  Received a booster against Covid.   PCP:   [facility] [place]  Patient's last menstrual period was 05/30/2010 (exact date).           Sexually active: No.  The current method of family planning is post menopausal status.    Exercising: No.  The patient does not participate in regular exercise at present. Smoker:  no  Health Maintenance: Pap:  11-16-2018 negative, HR HPV negative            10-28-15 negative, HR HPV negative  History of abnormal Pap:  no MMG:  05-29-20 density C/BIRADS 1 negative  Colonoscopy:  11-15-17 negative, f/u 10 years  BMD:   05-05-17  Result  Normal  TDaP:  2018 Gardasil:   no HIV: 10-28-15 negative  Hep C: 10-28-15 Screening Labs:  Hb today: discuss with provider, Urine today: not collected   reports that she has never smoked. She has never used smokeless tobacco. She reports previous alcohol use. She reports that she does not use drugs.  Past Medical History:  Diagnosis Date  . Acute blood loss anemia   . Arthritis    osteoarthritis- hip ,bilateral  . Bleeding nose    freq occ. -during childhood and related to sinus infection or dryness-less frequent now.  . Complication of anesthesia    Nausea and vomiting w Ether  . Elevated hemoglobin A1c   . Fracture of foot 08/2013   left  . GERD (gastroesophageal reflux disease)   . Hx of seasonal allergies   . IBS (irritable bowel syndrome)   . Loose stools   . Migraine headache    better since off OCP 07/2010. no longer a problem after menopause  . PONV (postoperative nausea and vomiting)   . Primary osteoarthritis of right hip   . Shingles   .  Unsteady gait     Past Surgical History:  Procedure Laterality Date  . APPENDECTOMY     child-   . 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 TOTAL HIP ARTHROPLASTY Left 07/2016   [facility]    Current Outpatient Medications  Medication Sig Dispense Refill  . BIOTIN PO Take by mouth.    . ELDERBERRY PO Take by mouth.    Marland Kitchen KRILL OIL PO Take by mouth.    . Multiple Vitamins-Minerals (MULTIVITAMIN PO) Take by mouth.    . polyethylene glycol (MIRALAX / GLYCOLAX) packet Take 17 g by mouth daily as needed for mild constipation. 14 each 0   No current facility-administered medications for this visit.    Family History  Problem Relation Age of Onset  . Alzheimer's disease Mother 56  . Prostate cancer Father 73  . Hyperlipidemia Father   . Breast cancer Maternal Grandmother 90    Review of Systems  All other systems reviewed and are negative.   Exam:   BP (!) 150/98 (BP Location: Right Arm, Patient Position: Sitting, Cuff Size: Normal)   Pulse 78   Resp 14  Ht 5\' 9"  (1.753 m)   Wt 170 lb (77.1 kg)   LMP 05/30/2010 (Exact Date)   BMI 25.10 kg/m     General appearance: alert, cooperative and appears stated age Head: normocephalic, without obvious abnormality, atraumatic Neck: no adenopathy, supple, symmetrical, trachea midline and thyroid normal to inspection and palpation Lungs: clear to auscultation bilaterally Breasts: normal appearance, no masses or tenderness, No nipple retraction or dimpling, No nipple discharge or bleeding, No axillary adenopathy Heart: regular rate and rhythm Abdomen: soft, non-tender; no masses, no organomegaly Extremities: extremities normal, atraumatic, no cyanosis or edema Skin: skin color, texture, turgor normal. No rashes or lesions Lymph nodes: cervical, supraclavicular, and axillary nodes  normal. Neurologic: grossly normal  Pelvic: External genitalia:  no lesions              No abnormal inguinal nodes palpated.              Urethra:  normal appearing urethra with no masses, tenderness or lesions              Bartholins and Skenes: normal                 Vagina: atrophy noted.               Cervix: no lesions              Pap taken: No. Bimanual Exam:  Uterus:  normal size, contour, position, consistency, mobility, non-tender              Adnexa: no mass, fullness, tenderness              Rectal exam: Yes.  .  Confirms.              Anus:  normal sphincter tone, no lesions  Chaperone was present for exam.  Assessment:   Well woman visit with normal exam. Elevated BP reading today.  FH Alzheimer's disease.   Plan: Mammogram screening discussed. Self breast awareness reviewed. Pap and HR HPV as above. Guidelines for Calcium, Vitamin D, regular exercise program including cardiovascular and weight bearing exercise. See PCP to establish care, BP recheck and routine blood work.  We discussed referral to a tertiary care center or neurology in [facility] for cognitive evaluation.  Follow up annually and prn.

## 2020-12-22 NOTE — Patient Instructions (Signed)

## 2021-12-20 NOTE — Progress Notes (Unsigned)
66 y.o. G56P0000 Divorced Caucasian female here for annual exam.    PCP:     Patient's last menstrual period was 05/30/2010 (exact date).           Sexually active: {yes no:314532}  The current method of family planning is {contraception:315051}.    Exercising: {yes no:314532}  {types:19826} Smoker:  no  Health Maintenance: Pap:  11-16-18 Normal neg HPV History of abnormal Pap:  no MMG:  06-04-21 normal Colonoscopy:  11-15-17 polyp BMD:   05-05-17  Result  normal TDaP:  2018 Gardasil:   no HIV:2017 NR Hep C:2017 neg Screening Labs:  Hb today: ***, Urine today: ***   reports that she has never smoked. She has never used smokeless tobacco. She reports that she does not currently use alcohol. She reports that she does not use drugs.  Past Medical History:  Diagnosis Date   Acute blood loss anemia    Arthritis    osteoarthritis- hip ,bilateral   Bleeding nose    freq occ. -during childhood and related to sinus infection or dryness-less frequent now.   Complication of anesthesia    Nausea and vomiting w Ether   Elevated hemoglobin A1c    Fracture of foot 08/2013   left   GERD (gastroesophageal reflux disease)    Hx of seasonal allergies    IBS (irritable bowel syndrome)    Loose stools    Migraine headache    better since off OCP 07/2010. no longer a problem after menopause   PONV (postoperative nausea and vomiting)    Primary osteoarthritis of right hip    Shingles    Unsteady gait     Past Surgical History:  Procedure Laterality Date   APPENDECTOMY     child-    TOTAL HIP ARTHROPLASTY Right 11/11/2015   Procedure: RIGHT TOTAL HIP ARTHROPLASTY ANTERIOR APPROACH;  Surgeon: Ollen Gross, MD;  Location: [facility];  Service: Orthopedics;  Laterality: Right;   TOTAL HIP ARTHROPLASTY Left 07/2016   [facility]    Current Outpatient Medications  Medication Sig Dispense Refill   BIOTIN PO Take by mouth.     ELDERBERRY PO Take by mouth.     KRILL OIL PO Take by mouth.      Multiple Vitamins-Minerals (MULTIVITAMIN PO) Take by mouth.     polyethylene glycol (MIRALAX / GLYCOLAX) packet Take 17 g by mouth daily as needed for mild constipation. 14 each 0   No current facility-administered medications for this visit.    Family History  Problem Relation Age of Onset   Alzheimer's disease Mother 76   Prostate cancer Father 23   Hyperlipidemia Father    Breast cancer Maternal Grandmother 57    Review of Systems  Exam:   LMP 05/30/2010 (Exact Date)     General appearance: alert, cooperative and appears stated age Head: normocephalic, without obvious abnormality, atraumatic Neck: no adenopathy, supple, symmetrical, trachea midline and thyroid normal to inspection and palpation Lungs: clear to auscultation bilaterally Breasts: normal appearance, no masses or tenderness, No nipple retraction or dimpling, No nipple discharge or bleeding, No axillary adenopathy Heart: regular rate and rhythm Abdomen: soft, non-tender; no masses, no organomegaly Extremities: extremities normal, atraumatic, no cyanosis or edema Skin: skin color, texture, turgor normal. No rashes or lesions Lymph nodes: cervical, supraclavicular, and axillary nodes normal. Neurologic: grossly normal  Pelvic: External genitalia:  no lesions              No abnormal inguinal nodes palpated.  Urethra:  normal appearing urethra with no masses, tenderness or lesions              Bartholins and Skenes: normal                 Vagina: normal appearing vagina with normal color and discharge, no lesions              Cervix: no lesions              Pap taken: {yes no:314532} Bimanual Exam:  Uterus:  normal size, contour, position, consistency, mobility, non-tender              Adnexa: no mass, fullness, tenderness              Rectal exam: {yes no:314532}.  Confirms.              Anus:  normal sphincter tone, no lesions  Chaperone was present for exam:  ***  Assessment:   Well woman visit  with gynecologic exam.   Plan: Mammogram screening discussed. Self breast awareness reviewed. Pap and HR HPV as above. Guidelines for Calcium, Vitamin D, regular exercise program including cardiovascular and weight bearing exercise.   Follow up annually and prn.   Additional counseling given.  {yes [id number]. _______ minutes face to face time of which over 50% was spent in counseling.    After visit summary provided.

## 2021-12-31 ENCOUNTER — Ambulatory Visit (INDEPENDENT_AMBULATORY_CARE_PROVIDER_SITE_OTHER): Payer: BC Managed Care – PPO | Admitting: Obstetrics and Gynecology

## 2021-12-31 ENCOUNTER — Encounter: Payer: Self-pay | Admitting: Obstetrics and Gynecology

## 2021-12-31 ENCOUNTER — Other Ambulatory Visit: Payer: Self-pay

## 2021-12-31 VITALS — BP 124/82 | HR 75 | Ht 68.5 in | Wt 171.0 lb

## 2021-12-31 DIAGNOSIS — Z01419 Encounter for gynecological examination (general) (routine) without abnormal findings: Secondary | ICD-10-CM

## 2021-12-31 NOTE — Patient Instructions (Signed)

## 2022-06-29 ENCOUNTER — Encounter: Payer: Self-pay | Admitting: Obstetrics and Gynecology

## 2022-07-07 ENCOUNTER — Encounter: Payer: Self-pay | Admitting: Obstetrics and Gynecology

## 2022-10-24 ENCOUNTER — Encounter: Payer: Self-pay | Admitting: Obstetrics and Gynecology

## 2023-01-10 NOTE — Progress Notes (Signed)
67 y.o. G65P0000 Divorced Caucasian female here for annual exam.    Denies vaginal bleeding.   Retired.  May consider working at [employer].   PCP:  [facility] in [place].   Patient's last menstrual period was 05/30/2010 (exact date).           Sexually active: No.  The current method of family planning is post menopausal status.    Exercising: No.     Smoker:  no  Health Maintenance: Pap:  11/16/18 neg: HR HPV neg History of abnormal Pap:  no MMG:  07/07/22 Breast Density Category B, BI-RADS CAT 1 neg Colonoscopy:  11/15/17 polyp BMD:   10/20/22 Result  normal TDaP:  2018 Gardasil:   no HIV: 2017 NR Hep C: 2017 neg Screening Labs:  PCP   reports that she has never smoked. She has never used smokeless tobacco. She reports current alcohol use. She reports that she does not use drugs.  Past Medical History:  Diagnosis Date   Acute blood loss anemia    Arthritis    osteoarthritis- hip ,bilateral   Bleeding nose    freq occ. -during childhood and related to sinus infection or dryness-less frequent now.   Complication of anesthesia    Nausea and vomiting w Ether   Elevated hemoglobin A1c    Fracture of foot 08/2013   left   GERD (gastroesophageal reflux disease)    Hx of seasonal allergies    IBS (irritable bowel syndrome)    Loose stools    Migraine headache    better since off OCP 07/2010. no longer a problem after menopause   PONV (postoperative nausea and vomiting)    Primary osteoarthritis of right hip    Shingles    Unsteady gait     Past Surgical History:  Procedure Laterality Date   APPENDECTOMY     child-    TOTAL HIP ARTHROPLASTY Right 11/11/2015   Procedure: RIGHT TOTAL HIP ARTHROPLASTY ANTERIOR APPROACH;  Surgeon: Ollen Gross, MD;  Location: [facility];  Service: Orthopedics;  Laterality: Right;   TOTAL HIP ARTHROPLASTY Left 07/2016   [facility]    Current Outpatient Medications  Medication Sig Dispense Refill   Biotin 10 MG TABS 1 tablet  Orally Once a day     Multiple Vitamins-Minerals (ALIVE WOMENS 50+) CHEW as directed Orally     polyethylene glycol (MIRALAX / GLYCOLAX) packet Take 17 g by mouth daily as needed for mild constipation. 14 each 0   No current facility-administered medications for this visit.    Family History  Problem Relation Age of Onset   Alzheimer's disease Mother 46   Prostate cancer Father 3   Hyperlipidemia Father    Breast cancer Maternal Grandmother 84    Review of Systems  All other systems reviewed and are negative.   Exam:   BP 126/80 (BP Location: Right Arm, Patient Position: Sitting, Cuff Size: Normal)   Pulse 76   Ht 5' 8.5" (1.74 m)   Wt 177 lb (80.3 kg)   LMP 05/30/2010 (Exact Date)   SpO2 97%   BMI 26.52 kg/m     General appearance: alert, cooperative and appears stated age Head: normocephalic, without obvious abnormality, atraumatic Neck: no adenopathy, supple, symmetrical, trachea midline and thyroid normal to inspection and palpation Lungs: clear to auscultation bilaterally Breasts: normal appearance, no masses or tenderness, No nipple retraction or dimpling, No nipple discharge or bleeding, No axillary adenopathy Heart: regular rate and rhythm Abdomen: soft, non-tender; no masses,  no organomegaly Extremities: extremities normal, atraumatic, no cyanosis or edema Skin: skin color, texture, turgor normal. No rashes or lesions Lymph nodes: cervical, supraclavicular, and axillary nodes normal. Neurologic: grossly normal  Pelvic: External genitalia:  no lesions              No abnormal inguinal nodes palpated.              Urethra:  normal appearing urethra with no masses, tenderness or lesions              Bartholins and Skenes: normal                 Vagina: normal appearing vagina with normal color and discharge, no lesions              Cervix: no lesions              Pap taken: yes Bimanual Exam:  Uterus:  normal size, contour, position, consistency, mobility,  non-tender              Adnexa: no mass, fullness, tenderness              Rectal exam: yes.  Confirms.              Anus:  normal sphincter tone, no lesions  Chaperone was present for exam:  Warren Lacymily F, CMA  Assessment:   Well woman visit with gynecologic exam. Cervical cancer screening.   Plan: Mammogram screening discussed. Self breast awareness reviewed. Pap and HR HPV collected. Guidelines for Calcium, Vitamin D, regular exercise program including cardiovascular and weight bearing exercise. Fu in 2 years for breast and pelvic exam and prn.

## 2023-01-24 ENCOUNTER — Ambulatory Visit (INDEPENDENT_AMBULATORY_CARE_PROVIDER_SITE_OTHER): Payer: Medicare PPO | Admitting: Obstetrics and Gynecology

## 2023-01-24 ENCOUNTER — Encounter: Payer: Self-pay | Admitting: Obstetrics and Gynecology

## 2023-01-24 ENCOUNTER — Other Ambulatory Visit (HOSPITAL_COMMUNITY)
Admission: RE | Admit: 2023-01-24 | Discharge: 2023-01-24 | Disposition: A | Discharge status: Home / Self Care | Payer: Medicare PPO | Source: Ambulatory Visit | Attending: Obstetrics and Gynecology | Admitting: Obstetrics and Gynecology

## 2023-01-24 VITALS — BP 126/80 | HR 76 | Ht 68.5 in | Wt 177.0 lb

## 2023-01-24 DIAGNOSIS — E559 Vitamin D deficiency, unspecified: Secondary | ICD-10-CM | POA: Diagnosis not present

## 2023-01-24 DIAGNOSIS — Z01419 Encounter for gynecological examination (general) (routine) without abnormal findings: Secondary | ICD-10-CM | POA: Insufficient documentation

## 2023-01-24 DIAGNOSIS — Z124 Encounter for screening for malignant neoplasm of cervix: Secondary | ICD-10-CM

## 2023-01-24 DIAGNOSIS — Z1151 Encounter for screening for human papillomavirus (HPV): Secondary | ICD-10-CM | POA: Insufficient documentation

## 2023-01-24 DIAGNOSIS — E785 Hyperlipidemia, unspecified: Secondary | ICD-10-CM | POA: Diagnosis not present

## 2023-01-24 NOTE — Patient Instructions (Signed)

## 2023-01-26 LAB — CYTOLOGY - PAP
Comment: NEGATIVE
Diagnosis: NEGATIVE
High risk HPV: NEGATIVE

## 2023-07-04 ENCOUNTER — Encounter: Payer: Self-pay | Admitting: Obstetrics and Gynecology

## 2024-07-08 DIAGNOSIS — Z1231 Encounter for screening mammogram for malignant neoplasm of breast: Secondary | ICD-10-CM | POA: Diagnosis not present

## 2024-07-08 LAB — HM MAMMOGRAPHY

## 2024-07-10 ENCOUNTER — Encounter: Payer: Self-pay | Admitting: Obstetrics and Gynecology

## 2024-07-11 ENCOUNTER — Ambulatory Visit: Payer: Self-pay | Admitting: Obstetrics and Gynecology

## 2024-07-25 DIAGNOSIS — Z131 Encounter for screening for diabetes mellitus: Secondary | ICD-10-CM | POA: Diagnosis not present

## 2024-07-25 DIAGNOSIS — Z0000 Encounter for general adult medical examination without abnormal findings: Secondary | ICD-10-CM | POA: Diagnosis not present

## 2024-07-25 DIAGNOSIS — Z1331 Encounter for screening for depression: Secondary | ICD-10-CM | POA: Diagnosis not present

## 2024-07-25 DIAGNOSIS — Z6825 Body mass index (BMI) 25.0-25.9, adult: Secondary | ICD-10-CM | POA: Diagnosis not present

## 2024-07-25 DIAGNOSIS — E7800 Pure hypercholesterolemia, unspecified: Secondary | ICD-10-CM | POA: Diagnosis not present
